# Patient Record
Sex: Male | Born: 1960 | Race: White | Hispanic: No | Marital: Married | Smoking: Former smoker
Health system: Southern US, Community
[De-identification: ages and names within clinical notes are randomized; demographics above are authoritative.]

## PROBLEM LIST (undated history)

## (undated) DIAGNOSIS — R51 Headache: Secondary | ICD-10-CM

## (undated) DIAGNOSIS — E785 Hyperlipidemia, unspecified: Secondary | ICD-10-CM

## (undated) DIAGNOSIS — J302 Other seasonal allergic rhinitis: Secondary | ICD-10-CM

## (undated) DIAGNOSIS — I10 Essential (primary) hypertension: Secondary | ICD-10-CM

## (undated) DIAGNOSIS — M1990 Unspecified osteoarthritis, unspecified site: Secondary | ICD-10-CM

## (undated) DIAGNOSIS — K219 Gastro-esophageal reflux disease without esophagitis: Secondary | ICD-10-CM

## (undated) DIAGNOSIS — F419 Anxiety disorder, unspecified: Secondary | ICD-10-CM

## (undated) DIAGNOSIS — F32A Depression, unspecified: Secondary | ICD-10-CM

## (undated) DIAGNOSIS — E781 Pure hyperglyceridemia: Secondary | ICD-10-CM

## (undated) DIAGNOSIS — F329 Major depressive disorder, single episode, unspecified: Secondary | ICD-10-CM

## (undated) DIAGNOSIS — J45909 Unspecified asthma, uncomplicated: Secondary | ICD-10-CM

## (undated) HISTORY — PX: OTHER SURGICAL HISTORY: SHX169

## (undated) HISTORY — DX: Morbid (severe) obesity due to excess calories: E66.01

## (undated) HISTORY — DX: Essential (primary) hypertension: I10

## (undated) HISTORY — DX: Pure hyperglyceridemia: E78.1

## (undated) HISTORY — DX: Hyperlipidemia, unspecified: E78.5

## (undated) HISTORY — PX: ROTATOR CUFF REPAIR: SHX139

---

## 2002-03-06 ENCOUNTER — Encounter: Payer: Self-pay | Admitting: Family Medicine

## 2002-03-06 ENCOUNTER — Ambulatory Visit (HOSPITAL_COMMUNITY): Admission: RE | Admit: 2002-03-06 | Discharge: 2002-03-06 | Payer: Self-pay | Admitting: Family Medicine

## 2003-01-21 ENCOUNTER — Encounter: Payer: Self-pay | Admitting: Emergency Medicine

## 2003-01-21 ENCOUNTER — Emergency Department (HOSPITAL_COMMUNITY): Admission: EM | Admit: 2003-01-21 | Discharge: 2003-01-21 | Payer: Self-pay | Admitting: Emergency Medicine

## 2003-02-10 ENCOUNTER — Ambulatory Visit (HOSPITAL_COMMUNITY): Admission: RE | Admit: 2003-02-10 | Discharge: 2003-02-10 | Payer: Self-pay | Admitting: Specialist

## 2003-02-10 ENCOUNTER — Encounter: Payer: Self-pay | Admitting: Specialist

## 2006-12-03 ENCOUNTER — Emergency Department (HOSPITAL_COMMUNITY): Admission: EM | Admit: 2006-12-03 | Discharge: 2006-12-03 | Payer: Self-pay | Admitting: Emergency Medicine

## 2006-12-03 ENCOUNTER — Encounter: Payer: Self-pay | Admitting: Emergency Medicine

## 2008-10-05 ENCOUNTER — Emergency Department (HOSPITAL_COMMUNITY): Admission: EM | Admit: 2008-10-05 | Discharge: 2008-10-05 | Payer: Self-pay | Admitting: Emergency Medicine

## 2008-10-06 ENCOUNTER — Encounter (INDEPENDENT_AMBULATORY_CARE_PROVIDER_SITE_OTHER): Payer: Self-pay | Admitting: *Deleted

## 2008-10-06 ENCOUNTER — Ambulatory Visit: Payer: Self-pay | Admitting: Orthopedic Surgery

## 2008-10-06 DIAGNOSIS — S61409A Unspecified open wound of unspecified hand, initial encounter: Secondary | ICD-10-CM | POA: Insufficient documentation

## 2008-10-15 ENCOUNTER — Ambulatory Visit: Payer: Self-pay | Admitting: Orthopedic Surgery

## 2008-10-15 ENCOUNTER — Encounter (INDEPENDENT_AMBULATORY_CARE_PROVIDER_SITE_OTHER): Payer: Self-pay | Admitting: *Deleted

## 2008-10-29 ENCOUNTER — Ambulatory Visit: Payer: Self-pay | Admitting: Orthopedic Surgery

## 2008-10-30 ENCOUNTER — Encounter (INDEPENDENT_AMBULATORY_CARE_PROVIDER_SITE_OTHER): Payer: Self-pay | Admitting: *Deleted

## 2008-11-10 ENCOUNTER — Encounter: Payer: Self-pay | Admitting: Orthopedic Surgery

## 2009-12-10 ENCOUNTER — Observation Stay (HOSPITAL_COMMUNITY): Admission: EM | Admit: 2009-12-10 | Discharge: 2009-12-11 | Payer: Self-pay | Admitting: Emergency Medicine

## 2009-12-10 ENCOUNTER — Ambulatory Visit: Payer: Self-pay | Admitting: Cardiology

## 2009-12-10 ENCOUNTER — Encounter (INDEPENDENT_AMBULATORY_CARE_PROVIDER_SITE_OTHER): Payer: Self-pay | Admitting: Internal Medicine

## 2009-12-30 ENCOUNTER — Encounter: Payer: Self-pay | Admitting: Adult Health

## 2009-12-30 ENCOUNTER — Ambulatory Visit: Payer: Self-pay | Admitting: Cardiology

## 2009-12-30 DIAGNOSIS — F411 Generalized anxiety disorder: Secondary | ICD-10-CM | POA: Insufficient documentation

## 2009-12-30 DIAGNOSIS — I10 Essential (primary) hypertension: Secondary | ICD-10-CM | POA: Insufficient documentation

## 2009-12-30 DIAGNOSIS — R079 Chest pain, unspecified: Secondary | ICD-10-CM | POA: Insufficient documentation

## 2010-06-17 NOTE — Assessment & Plan Note (Signed)
Summary: POST HOSP [place]/TG   Visit Type:  Follow-up Referring Deva Ron:  emergency room Primary Taija Mathias:  Dr.Fusco  CC:  no cardiology complaints.  History of Present Illness: Bryce Rush is a very pleasant 50 y/o CM who we are seeing on follow-up after hospitalization at [facility] for chest pain.  Cardiac enzymes and EKG were found to be normal.  Stress myoview was also found to  be normal.  He has a history of anxiety, hypertension, and diverticulitis.  He has recently been informed that he is being lald off from his job in Oct.  This has been very stressful to him. He denies recurrent chest discomfort since being discharged from [facility].  He has been placed on celexa and is complaining of ED since that time.  Otherwise he is without complaint.  Preventive Screening-Counseling & Management  Alcohol-Tobacco     Alcohol drinks/day: 1     Alcohol type: beer  Current Medications (verified): 1)  Aspir-Low 81 Mg Tbec (Aspirin) .... Take 1 Tab Daily 2)  Lipitor 40 Mg Tabs (Atorvastatin Calcium) .... Take 1 Tab Daily 3)  Nitrostat 0.4 Mg Subl (Nitroglycerin) .... Use As Directed For Chest Pain Not To Exceed 3 Tabs 4)  Celexa 40 Mg Tabs (Citalopram Hydrobromide) .... Take 1 Tab Daily 5)  Lisinopril 10 Mg Tabs (Lisinopril) .... Take 1 Tab Daily 6)  Locoid Lipocream 0.1 % Crea (Hydrocortisone Butyr Lipo Base) .... Apply Twice Daily 7)  Daily Multiple Vitamins  Tabs (Multiple Vitamin) .... Take 1 Tab Daily 8)  Cetirizine Hcl 10 Mg Tabs (Cetirizine Hcl) .... Take 1 Tab Daily  Allergies (verified): No Known Drug Allergies  Past History:  Past medical, surgical, family and social histories (including risk factors) reviewed, and no changes noted (except as noted below).  Past Medical History: Reviewed history from 12/25/2009 and no changes required. no major medical problems chest pain(myoview done 12/11/2009) dyslipidemia hypertension hypertriglyceridemia morbid obesity  Past Surgical  History: Reviewed history from 10/06/2008 and no changes required.  Rotator Cuff Repair4/[facility]  Family History: Reviewed history from 12/25/2009 and no changes required. negative family history Father:deceased early 34's with myocardial infarction Mother:alive with history of pulmonary emboli no cardiovascular disease.  Social History: Reviewed history from 12/25/2009 and no changes required. Patient is married.  Full Time Married  Tobacco Use - No.  Alcohol Use - yes(social) Regular Exercise - no Drug Use - no Alcohol drinks/day:  1  Review of Systems       All other systems have been reviewed and are negative unless stated above.   Vital Signs:  Patient profile:   50 year old male Height:      69 inches Weight:      209 pounds BMI:     30.98 Pulse rate:   82 / minute BP sitting:   119 / 69  (right arm)  Vitals Entered By: Dreama Saa, CNA (December 30, 2009 3:31 PM)  Physical Exam  General:  Well developed, well nourished, in no acute distress. Eyes:  L eye deviation Lungs:  Clear bilaterally to auscultation and percussion. Heart:  Non-displaced PMI, chest non-tender; regular rate and rhythm, S1, S2 without murmurs, rubs or gallops. Carotid upstroke normal, no bruit. Normal abdominal aortic size, no bruits. Femorals normal pulses, no bruits. Pedals normal pulses. No edema, no varicosities. Abdomen:  Bowel sounds positive; abdomen soft and non-tender without masses, organomegaly, or hernias noted. No hepatosplenomegaly. Obese Msk:  Back normal, normal gait. Muscle strength and tone normal.  Pulses:  pulses normal in all 4 extremities Extremities:  No clubbing or cyanosis. Neurologic:  Alert and oriented x 3. Psych:  Normal affect.   EKG  Procedure date:  12/30/2009  Findings:      Normal sinus rhythm with rate of:  78bpm  Impression & Recommendations:  Problem # 1:  CHEST PAIN-UNSPECIFIED (ICD-786.50) Patients tests during  hospitalization were found to be negative.  There was no indication for cardiac catherization.  He has had no further complaints of chest discomfort. Discussed the test results with the patient and his wife, providing a copy of the stress test results to him.  He states that it was not explained to him during hospitalization and he was reassured of the results.  No further cardiac w/u indicated at this time. His updated medication list for this problem includes:    Aspir-low 81 Mg Tbec (Aspirin) .Marland Kitchen... Take 1 tab daily    Nitrostat 0.4 Mg Subl (Nitroglycerin) ..... Use as directed for chest pain not to exceed 3 tabs    Lisinopril 10 Mg Tabs (Lisinopril) .Marland Kitchen... Take 1 tab daily  Problem # 2:  CHYPERTENSION, BENIGN (ICD-401.1) Currently well controlled at present.  Continue he medications as directed. His updated medication list for this problem includes:    Aspir-low 81 Mg Tbec (Aspirin) .Marland Kitchen... Take 1 tab daily    Lisinopril 10 Mg Tabs (Lisinopril) .Marland Kitchen... Take 1 tab daily  Problem # 3:  ANXIETY (ICD-300.00) Situational symptoms with loss of job. He is complaining of ED associated with this.  I have referred him to his primary care physician for alternative medications that do not cause this side effect.  Patient Instructions: 1)  Your physician recommends that you schedule a follow-up appointment in: as needed

## 2010-07-31 LAB — CARDIAC PANEL(CRET KIN+CKTOT+MB+TROPI)
CK, MB: 2.5 ng/mL (ref 0.3–4.0)
CK, MB: 2.9 ng/mL (ref 0.3–4.0)
Relative Index: 1.9 (ref 0.0–2.5)
Relative Index: 2.1 (ref 0.0–2.5)
Total CK: 123 U/L (ref 7–232)
Total CK: 154 U/L (ref 7–232)
Troponin I: 0.02 ng/mL (ref 0.00–0.06)
Troponin I: 0.02 ng/mL (ref 0.00–0.06)

## 2010-07-31 LAB — DIFFERENTIAL
Eosinophils Absolute: 0.1 10*3/uL (ref 0.0–0.7)
Lymphs Abs: 3.3 10*3/uL (ref 0.7–4.0)
Monocytes Absolute: 0.7 10*3/uL (ref 0.1–1.0)
Monocytes Relative: 9 % (ref 3–12)
Neutrophils Relative %: 47 % (ref 43–77)

## 2010-07-31 LAB — BASIC METABOLIC PANEL
CO2: 30 mEq/L (ref 19–32)
Calcium: 9.2 mg/dL (ref 8.4–10.5)
Chloride: 103 mEq/L (ref 96–112)
GFR calc Af Amer: >60 mL/min (ref >60)
Glucose, Bld: 102 mg/dL — ABNORMAL HIGH (ref 70–99)
Potassium: 3.8 mEq/L (ref 3.5–5.1)
Sodium: 138 mEq/L (ref 135–145)

## 2010-07-31 LAB — LIPID PANEL
Cholesterol: 228 mg/dL — ABNORMAL HIGH (ref 0–200)
HDL: 32 mg/dL — ABNORMAL LOW (ref >39)
VLDL: 43 mg/dL — ABNORMAL HIGH (ref 0–40)

## 2010-07-31 LAB — CBC
HCT: 44 % (ref 39.0–52.0)
Hemoglobin: 14.2 g/dL (ref 13.0–17.0)
Hemoglobin: 15.4 g/dL (ref 13.0–17.0)
MCH: 31.8 pg (ref 26.0–34.0)
MCV: 90 fL (ref 78.0–100.0)
MCV: 90.8 fL (ref 78.0–100.0)
Platelets: 171 10*3/uL (ref 150–400)
RBC: 4.6 MIL/uL (ref 4.22–5.81)
RBC: 4.84 MIL/uL (ref 4.22–5.81)
WBC: 6.1 10*3/uL (ref 4.0–10.5)

## 2010-07-31 LAB — COMPREHENSIVE METABOLIC PANEL
AST: 19 U/L (ref 0–37)
Albumin: 3.6 g/dL (ref 3.5–5.2)
Alkaline Phosphatase: 62 U/L (ref 39–117)
CO2: 30 mEq/L (ref 19–32)
Chloride: 104 mEq/L (ref 96–112)
GFR calc Af Amer: >60 mL/min (ref >60)
GFR calc non Af Amer: >60 mL/min (ref >60)
Potassium: 4.2 mEq/L (ref 3.5–5.1)
Total Bilirubin: 0.6 mg/dL (ref 0.3–1.2)

## 2010-07-31 LAB — POCT CARDIAC MARKERS: Myoglobin, poc: 72.4 ng/mL (ref 12–200)

## 2010-07-31 LAB — TSH: TSH: 0.945 u[IU]/mL (ref 0.350–4.500)

## 2011-02-21 ENCOUNTER — Encounter: Payer: Self-pay | Admitting: Adult Health

## 2011-02-28 LAB — URINALYSIS, ROUTINE W REFLEX MICROSCOPIC
Ketones, ur: NEGATIVE
Nitrite: NEGATIVE
Specific Gravity, Urine: 1.012
pH: 7.5

## 2011-02-28 LAB — BASIC METABOLIC PANEL
BUN: 9
GFR calc Af Amer: >60
GFR calc non Af Amer: >60
Potassium: 3.7
Sodium: 137

## 2011-02-28 LAB — CBC
HCT: 46.9
Platelets: 231
RBC: 5.39
WBC: 6.9

## 2011-02-28 LAB — DIFFERENTIAL
Eosinophils Relative: 1
Lymphocytes Relative: 28
Lymphs Abs: 1.9

## 2011-06-21 ENCOUNTER — Telehealth: Payer: Self-pay

## 2011-06-21 ENCOUNTER — Other Ambulatory Visit: Payer: Self-pay

## 2011-06-21 DIAGNOSIS — Z139 Encounter for screening, unspecified: Secondary | ICD-10-CM

## 2011-06-21 NOTE — Telephone Encounter (Signed)
Gastroenterology Pre-Procedure Form    Request Date: 06/21/2011       Requesting Physician: Dr. Sherwood Gambler     PATIENT INFORMATION:  Bryce Rush is a 51 y.o., male (DOB=1961-04-27).  PROCEDURE: Procedure(s) requested: colonoscopy Procedure Reason: screening for colon cancer  PATIENT REVIEW QUESTIONS: The patient reports the following:   1. Diabetes Melitis: no 2. Joint replacements in the past 12 months: no 3. Major health problems in the past 3 months: no 4. Has an artificial valve or MVP:no 5. Has been advised in past to take antibiotics in advance of a procedure like teeth cleaning: no}    MEDICATIONS & ALLERGIES:    Patient reports the following regarding taking any blood thinners:   Plavix? no Aspirin?no Coumadin?  no  Patient confirms/reports the following medications:  Current Outpatient Prescriptions  Medication Sig Dispense Refill  . ALPRAZolam (XANAX) 0.5 MG tablet Take 0.5 mg by mouth at bedtime as needed. Pt said he does not even take one daily. He takes one just as needed      . aspirin 81 MG tablet Take 81 mg by mouth daily.        Marland Kitchen buPROPion (WELLBUTRIN) 100 MG tablet Take 100 mg by mouth 2 (two) times daily. Just started taking one tablet daily x 7 days and then will begin one tablet three times a day      . cetirizine (ZYRTEC) 10 MG tablet Take 10 mg by mouth daily.        . citalopram (CELEXA) 40 MG tablet Take 40 mg by mouth daily.        . hydrocortisone butyrate (LUCOID) 0.1 % CREA cream Apply topically 2 (two) times daily.        Marland Kitchen lisinopril (PRINIVIL,ZESTRIL) 10 MG tablet Take 10 mg by mouth daily.        . metroNIDAZOLE (METROGEL) 1 % gel Apply topically daily. As directed      . Multiple Vitamin (MULTIVITAMIN) tablet Take 1 tablet by mouth daily.        . nitroGLYCERIN (NITROSTAT) 0.4 MG SL tablet Place 0.4 mg under the tongue as directed.        . pravastatin (PRAVACHOL) 40 MG tablet Take 40 mg by mouth daily.        Patient confirms/reports the  following allergies:  Allergies  Allergen Reactions  . Penicillins Other (See Comments)    Pt unsure/ was as a child  . Sulfa Antibiotics Other (See Comments)    Pt unsure/ as a child    Patient is appropriate to schedule for requested procedure(s): yes  AUTHORIZATION INFORMATION Primary Insurance:   ID #:   Group #:  Pre-Cert / Auth required: Pre-Cert / Auth #:   Secondary Insurance:   ID #:   Group #: Pre-Cert / Auth required:  Pre-Cert / Auth #:   No orders of the defined types were placed in this encounter.    SCHEDULE INFORMATION: Procedure has been scheduled as follows:  Date: 07/22/2011                    Time: 8:30 AM  Location: [facility] Short Stay  This Gastroenterology Pre-Precedure Form is being routed to the following provider(s) for review: Jonette Eva, MD

## 2011-06-21 NOTE — Telephone Encounter (Signed)
Pt called while triage nurse was at lunch. He wants to change his procedure date from 3/8 to 3/11 due to taking time off work. Please call him back at [phone] with new procedure time for 3/11

## 2011-06-21 NOTE — Telephone Encounter (Signed)
Rx and instructions mailed to pt.  

## 2011-06-21 NOTE — Telephone Encounter (Signed)
prepopik

## 2011-06-21 NOTE — Telephone Encounter (Signed)
Attempted to call pt. The connection sounded like fax, no answer. Nash Dimmer, [place].

## 2011-07-15 ENCOUNTER — Telehealth: Payer: Self-pay

## 2011-07-15 NOTE — Telephone Encounter (Signed)
Called to update triage prior to colonoscopy on 07/25/2011. No new medical problems and no change in medications.

## 2011-07-19 ENCOUNTER — Emergency Department (HOSPITAL_COMMUNITY)
Admission: EM | Admit: 2011-07-19 | Discharge: 2011-07-19 | Disposition: A | Discharge status: Home / Self Care | Payer: BC Managed Care – PPO | Attending: Emergency Medicine | Admitting: Emergency Medicine

## 2011-07-19 ENCOUNTER — Encounter (HOSPITAL_COMMUNITY): Payer: Self-pay | Admitting: *Deleted

## 2011-07-19 ENCOUNTER — Encounter (HOSPITAL_COMMUNITY): Payer: Self-pay | Admitting: Pharmacy Technician

## 2011-07-19 ENCOUNTER — Emergency Department (HOSPITAL_COMMUNITY): Payer: BC Managed Care – PPO

## 2011-07-19 DIAGNOSIS — Z7982 Long term (current) use of aspirin: Secondary | ICD-10-CM | POA: Insufficient documentation

## 2011-07-19 DIAGNOSIS — R109 Unspecified abdominal pain: Secondary | ICD-10-CM | POA: Insufficient documentation

## 2011-07-19 DIAGNOSIS — M7918 Myalgia, other site: Secondary | ICD-10-CM

## 2011-07-19 DIAGNOSIS — IMO0001 Reserved for inherently not codable concepts without codable children: Secondary | ICD-10-CM | POA: Insufficient documentation

## 2011-07-19 DIAGNOSIS — I10 Essential (primary) hypertension: Secondary | ICD-10-CM | POA: Insufficient documentation

## 2011-07-19 DIAGNOSIS — Z6829 Body mass index (BMI) 29.0-29.9, adult: Secondary | ICD-10-CM | POA: Insufficient documentation

## 2011-07-19 DIAGNOSIS — E785 Hyperlipidemia, unspecified: Secondary | ICD-10-CM | POA: Insufficient documentation

## 2011-07-19 LAB — URINALYSIS, ROUTINE W REFLEX MICROSCOPIC
Leukocytes, UA: NEGATIVE
Nitrite: NEGATIVE
Specific Gravity, Urine: 1.01 (ref 1.005–1.030)
Urobilinogen, UA: 0.2 mg/dL (ref 0.0–1.0)
pH: 7 (ref 5.0–8.0)

## 2011-07-19 MED ORDER — KETOROLAC TROMETHAMINE 30 MG/ML IJ SOLN
30.0000 mg | Freq: Once | INTRAMUSCULAR | Status: AC
  Administered 2011-07-19: 30 mg via INTRAVENOUS
  Filled 2011-07-19: qty 1

## 2011-07-19 MED ORDER — SODIUM CHLORIDE 0.9 % IV BOLUS (SEPSIS)
1000.0000 mL | Freq: Once | INTRAVENOUS | Status: AC
  Administered 2011-07-19: 1000 mL via INTRAVENOUS

## 2011-07-19 MED ORDER — MORPHINE SULFATE 4 MG/ML IJ SOLN
4.0000 mg | Freq: Once | INTRAMUSCULAR | Status: AC
  Administered 2011-07-19: 4 mg via INTRAVENOUS
  Filled 2011-07-19: qty 1

## 2011-07-19 MED ORDER — CYCLOBENZAPRINE HCL 10 MG PO TABS
10.0000 mg | ORAL_TABLET | Freq: Once | ORAL | Status: AC
  Administered 2011-07-19: 10 mg via ORAL

## 2011-07-19 MED ORDER — IBUPROFEN 600 MG PO TABS: 600.0000 mg | ORAL_TABLET | Freq: Four times a day (QID) | ORAL | Status: DC | PRN

## 2011-07-19 MED ORDER — CYCLOBENZAPRINE HCL 10 MG PO TABS: 10.0000 mg | ORAL_TABLET | Freq: Three times a day (TID) | ORAL | Status: DC | PRN

## 2011-07-19 MED ORDER — ONDANSETRON 4 MG PO TBDP
4.0000 mg | ORAL_TABLET | Freq: Once | ORAL | Status: AC
  Administered 2011-07-19: 4 mg via ORAL
  Filled 2011-07-19: qty 1

## 2011-07-19 MED ORDER — SODIUM CHLORIDE 0.9 % IV SOLN
INTRAVENOUS | Status: DC
  Administered 2011-07-19: 05:00:00 via INTRAVENOUS

## 2011-07-19 MED ORDER — CYCLOBENZAPRINE HCL 10 MG PO TABS
ORAL_TABLET | ORAL | Status: AC
  Filled 2011-07-19: qty 1

## 2011-07-19 NOTE — ED Notes (Signed)
Pt reports "stabbing" pain in rt flank starting approx 2 hs ago

## 2011-07-19 NOTE — Telephone Encounter (Signed)
REVIEWED.  

## 2011-07-19 NOTE — ED Provider Notes (Signed)
0730Patient here with right flank pain and c/o cough. CT negative for stones. Awaiting UA and chest xray.  UA and chest xrays are negative. Patient for discharge.  Nicoletta Dress. Colon Branch, MD 07/19/11 [phone]

## 2011-07-19 NOTE — Discharge Instructions (Signed)
Try heat on your back (don't burn your skin). Take the medications for your pain. Recheck if you get a fever, shortness of breath, cough or seem worse.

## 2011-07-19 NOTE — ED Notes (Signed)
Report given to Corena Herter RN

## 2011-07-19 NOTE — ED Provider Notes (Signed)
History     CSN: [id number]  Arrival date & time 07/19/11  0500   First MD Initiated Contact with Patient 07/19/11 0522      Chief Complaint  Patient presents with  . Flank Pain    (Consider location/radiation/quality/duration/timing/severity/associated sxs/prior treatment) HPI  Patient relates he gets up at 3:30 in the morning for his work. He relates about 2 hours ago he started having a stabbing pain in his right flank. He denies radiation. He denies nausea, vomiting, hematuria, diarrhea. He states taking a deep breath makes it hurt more. He states trying to relax makes it feel a little better but doesn't make it go away. He states at its worst it has been a 10 out of 10, he states currently it is 7/10. He states he's never had this before.  Patient relates his father had large kidney stones for years until he had what sounds like a parathyroidectomy done.  PCP Dr. Sherwood Gambler  Past Medical History  Diagnosis Date  . Chest pain     Myoview done 12/11/2009  . Dyslipidemia   . Hypertension   . Hypertriglyceridemia   . Morbid obesity     Past Surgical History  Procedure Date  . Rotator cuff repair     [facility]    Family History  Problem Relation Age of Onset  . Pulmonary embolism Mother   . Heart attack Father   renal stones FOP  History  Substance Use Topics  . Smoking status: Never Smoker   . Smokeless tobacco: Not on file  . Alcohol Use: Yes     Socially   Lives with spouse   Review of Systems  All other systems reviewed and are negative.    Allergies  Penicillins and Sulfa antibiotics  Home Medications   Current Outpatient Rx  Name Route Sig Dispense Refill  . ALPRAZOLAM 0.5 MG PO TABS Oral Take 0.5 mg by mouth at bedtime as needed. Pt said he does not even take one daily. He takes one just as needed    . ASPIRIN 81 MG PO TABS Oral Take 81 mg by mouth daily.      . BUPROPION HCL 100 MG PO TABS Oral Take 100 mg by mouth 2 (two) times  daily. Just started taking one tablet daily x 7 days and then will begin one tablet three times a day    . CETIRIZINE HCL 10 MG PO TABS Oral Take 10 mg by mouth daily.      Marland Kitchen CITALOPRAM HYDROBROMIDE 40 MG PO TABS Oral Take 40 mg by mouth daily.      Marland Kitchen HYDROCORTISONE BUTYRATE 0.1 % EX CREA Topical Apply topically 2 (two) times daily.      Marland Kitchen LISINOPRIL 10 MG PO TABS Oral Take 10 mg by mouth daily.      Marland Kitchen METRONIDAZOLE 1 % EX GEL Topical Apply topically daily. As directed    . ONE-DAILY MULTI VITAMINS PO TABS Oral Take 1 tablet by mouth daily.      Marland Kitchen NITROGLYCERIN 0.4 MG SL SUBL Sublingual Place 0.4 mg under the tongue as directed.      Marland Kitchen PRAVASTATIN SODIUM 40 MG PO TABS Oral Take 40 mg by mouth daily.      BP 144/91  Pulse 88  Temp(Src) 97.1 F (36.2 C) (Oral)  Resp 20  Ht 5\' 9"  (1.753 m)  Wt 203 lb (92.08 kg)  BMI 29.98 kg/m2  SpO2 97%  Vital signs normal    Physical Exam  Nursing note and vitals reviewed. Constitutional: He is oriented to person, place, and time. He appears well-developed and well-nourished.  Non-toxic appearance. He does not appear ill. No distress.  HENT:  Head: Normocephalic and atraumatic.  Right Ear: External ear normal.  Left Ear: External ear normal.  Nose: Nose normal. No mucosal edema or rhinorrhea.  Mouth/Throat: Oropharynx is clear and moist and mucous membranes are normal. No dental abscesses or uvula swelling.  Eyes: Conjunctivae and EOM are normal. Pupils are equal, round, and reactive to light.  Neck: Normal range of motion and full passive range of motion without pain. Neck supple.  Cardiovascular: Normal rate, regular rhythm and normal heart sounds.  Exam reveals no gallop and no friction rub.   No murmur heard. Pulmonary/Chest: Effort normal and breath sounds normal. No respiratory distress. He has no wheezes. He has no rhonchi. He has no rales. He exhibits no tenderness and no crepitus.  Abdominal: Soft. Normal appearance and bowel sounds are  normal. He exhibits no distension. There is no tenderness. There is no rebound and no guarding.       Patient has tenderness over his right flank.  Musculoskeletal: Normal range of motion. He exhibits no edema and no tenderness.       Moves all extremities well.   Neurological: He is alert and oriented to person, place, and time. He has normal strength. No cranial nerve deficit.  Skin: Skin is warm, dry and intact. No rash noted. No erythema. No pallor.  Psychiatric: He has a normal mood and affect. His speech is normal and behavior is normal. His mood appears not anxious.    ED Course  Procedures (including critical care time)  PT given IV morphine and zofran. After reviewing his CT scan he was given IV toradol. PT states he did have a mild cough this am. Will check CT scan.   Will turn over to Dr Colon Branch at change of shift to get UA results and CXR results.  Ct Abdomen Pelvis Wo Contrast  07/19/2011  *RADIOLOGY REPORT*  Clinical Data: Right flank pain question kidney stone  CT ABDOMEN AND PELVIS WITHOUT CONTRAST  Technique:  Multidetector CT imaging of the abdomen and pelvis was performed following the standard protocol without intravenous contrast.  Comparison: 12/03/2006  Findings: Lung bases clear. No urinary tract calcification, hydronephrosis or ureteral dilatation. Within limits of a nonenhanced exam, no focal abnormalities of the liver, spleen, pancreas, kidneys, or adrenal glands identified. Normal appendix. Diverticulosis of transverse through sigmoid colon without evidence of acute diverticulitis. Stomach and small bowel loops normal appearance. No mass, adenopathy or free fluid. Mild dilatation of proximal right inguinal canal question inguinal hernia containing fat. Few pelvic phleboliths. No acute osseous findings.  IMPRESSION: Question right inguinal hernia containing fat. Diverticulosis of transverse, descending and sigmoid colon without evidence of acute diverticulitis. No acute  abnormalities.  Original Report Authenticated By: Lollie Marrow, M.D.      1. Right flank pain   2. Musculoskeletal pain     New Prescriptions   CYCLOBENZAPRINE (FLEXERIL) 10 MG TABLET    Take 1 tablet (10 mg total) by mouth 3 (three) times daily as needed for muscle spasms.   IBUPROFEN (ADVIL,MOTRIN) 600 MG TABLET    Take 1 tablet (600 mg total) by mouth every 6 (six) hours as needed for pain.   Plan discharge by Dr Colon Branch after UA and CXR resulted  Devoria Albe, MD, FACEP    MDM  Ward Givens, MD 07/19/11 1859

## 2011-07-22 MED ORDER — SODIUM CHLORIDE 0.45 % IV SOLN
Freq: Once | INTRAVENOUS | Status: AC
  Administered 2011-07-25: 09:00:00 via INTRAVENOUS

## 2011-07-25 ENCOUNTER — Ambulatory Visit (HOSPITAL_COMMUNITY)
Admission: RE | Admit: 2011-07-25 | Discharge: 2011-07-25 | Disposition: A | Discharge status: Home / Self Care | Payer: BC Managed Care – PPO | Source: Ambulatory Visit | Attending: Gastroenterology | Admitting: Gastroenterology

## 2011-07-25 ENCOUNTER — Encounter (HOSPITAL_COMMUNITY): Payer: Self-pay | Admitting: *Deleted

## 2011-07-25 ENCOUNTER — Encounter (HOSPITAL_COMMUNITY)
Admission: RE | Disposition: A | Discharge status: Home / Self Care | Payer: Self-pay | Source: Ambulatory Visit | Attending: Gastroenterology

## 2011-07-25 DIAGNOSIS — Z1211 Encounter for screening for malignant neoplasm of colon: Secondary | ICD-10-CM | POA: Insufficient documentation

## 2011-07-25 DIAGNOSIS — I10 Essential (primary) hypertension: Secondary | ICD-10-CM | POA: Insufficient documentation

## 2011-07-25 DIAGNOSIS — E785 Hyperlipidemia, unspecified: Secondary | ICD-10-CM | POA: Insufficient documentation

## 2011-07-25 DIAGNOSIS — Z79899 Other long term (current) drug therapy: Secondary | ICD-10-CM | POA: Insufficient documentation

## 2011-07-25 DIAGNOSIS — K5730 Diverticulosis of large intestine without perforation or abscess without bleeding: Secondary | ICD-10-CM

## 2011-07-25 DIAGNOSIS — Z139 Encounter for screening, unspecified: Secondary | ICD-10-CM

## 2011-07-25 DIAGNOSIS — K648 Other hemorrhoids: Secondary | ICD-10-CM | POA: Insufficient documentation

## 2011-07-25 DIAGNOSIS — E781 Pure hyperglyceridemia: Secondary | ICD-10-CM | POA: Insufficient documentation

## 2011-07-25 HISTORY — DX: Depression, unspecified: F32.A

## 2011-07-25 HISTORY — DX: Headache: R51

## 2011-07-25 HISTORY — DX: Gastro-esophageal reflux disease without esophagitis: K21.9

## 2011-07-25 HISTORY — DX: Anxiety disorder, unspecified: F41.9

## 2011-07-25 HISTORY — DX: Other seasonal allergic rhinitis: J30.2

## 2011-07-25 HISTORY — DX: Major depressive disorder, single episode, unspecified: F32.9

## 2011-07-25 HISTORY — PX: COLONOSCOPY: SHX5424

## 2011-07-25 SURGERY — COLONOSCOPY
Anesthesia: Moderate Sedation

## 2011-07-25 MED ORDER — MEPERIDINE HCL 100 MG/ML IJ SOLN
INTRAMUSCULAR | Status: AC
  Filled 2011-07-25: qty 2

## 2011-07-25 MED ORDER — MIDAZOLAM HCL 5 MG/5ML IJ SOLN
INTRAMUSCULAR | Status: DC | PRN
  Administered 2011-07-25 (×2): 2 mg via INTRAVENOUS

## 2011-07-25 MED ORDER — PROMETHAZINE HCL 25 MG/ML IJ SOLN
INTRAMUSCULAR | Status: DC | PRN
  Administered 2011-07-25: 12.5 mg via INTRAVENOUS

## 2011-07-25 MED ORDER — PROMETHAZINE HCL 25 MG/ML IJ SOLN
INTRAMUSCULAR | Status: AC
  Filled 2011-07-25: qty 1

## 2011-07-25 MED ORDER — MEPERIDINE HCL 100 MG/ML IJ SOLN
INTRAMUSCULAR | Status: DC | PRN
  Administered 2011-07-25 (×2): 50 mg via INTRAVENOUS

## 2011-07-25 MED ORDER — STERILE WATER FOR IRRIGATION IR SOLN
Status: DC | PRN
  Administered 2011-07-25: 09:00:00

## 2011-07-25 MED ORDER — MIDAZOLAM HCL 5 MG/5ML IJ SOLN
INTRAMUSCULAR | Status: AC
  Filled 2011-07-25: qty 10

## 2011-07-25 MED ORDER — SODIUM CHLORIDE 0.9 % IJ SOLN
INTRAMUSCULAR | Status: AC
  Filled 2011-07-25: qty 10

## 2011-07-25 NOTE — H&P (Signed)
Primary Care Physician:  Cassell Smiles., MD, MD Primary Gastroenterologist:  Dr. Darrick Penna  Pre-Procedure History & Physical: HPI:  Bryce Rush is a 51 y.o. male here for COLON CANCER SCREENING.   Past Medical History  Diagnosis Date  . Chest pain     Myoview done 12/11/2009  . Dyslipidemia   . Hypertension   . Hypertriglyceridemia   . Morbid obesity   . Seasonal allergies   . Depression   . Headache   . GERD (gastroesophageal reflux disease)   . Anxiety     Past Surgical History  Procedure Date  . Rotator cuff repair     [facility]  . Bilateral eye surgery     X 4- as a child    Prior to Admission medications   Medication Sig Start Date End Date Taking? Authorizing Provider  ALPRAZolam Prudy Feeler) 0.5 MG tablet Take 0.5 mg by mouth at bedtime as needed. For sleep   Yes Historical Provider, MD  aspirin EC 81 MG tablet Take 81 mg by mouth daily.   Yes Historical Provider, MD  cetirizine (ZYRTEC) 10 MG tablet Take 10 mg by mouth daily.     Yes Historical Provider, MD  cyclobenzaprine (FLEXERIL) 10 MG tablet Take 10 mg by mouth 3 (three) times daily as needed. For muscle spasms 07/19/11 07/29/11 Yes Ward Givens, MD  hydrocortisone butyrate (LUCOID) 0.1 % CREA cream Apply 1 application topically daily as needed. Dry skin on face   Yes Historical Provider, MD  ibuprofen (ADVIL,MOTRIN) 600 MG tablet Take 600 mg by mouth every 6 (six) hours as needed. For pain 07/19/11 07/29/11 Yes Ward Givens, MD  Multiple Vitamin (MULITIVITAMIN WITH MINERALS) TABS Take 1 tablet by mouth daily.   Yes Historical Provider, MD  naproxen sodium (ALEVE) 220 MG tablet Take 220 mg by mouth 2 (two) times daily as needed. Pain   Yes Historical Provider, MD  nitroGLYCERIN (NITROSTAT) 0.4 MG SL tablet Place 0.4 mg under the tongue every 5 (five) minutes as needed. For chest pain   Yes Historical Provider, MD  buPROPion (WELLBUTRIN) 100 MG tablet Take 100 mg by mouth 2 (two) times daily.     Historical  Provider, MD  Cholecalciferol (VITAMIN D PO) Take 2 tablets by mouth daily.    Historical Provider, MD  citalopram (CELEXA) 40 MG tablet Take 40 mg by mouth daily.      Historical Provider, MD  lisinopril (PRINIVIL,ZESTRIL) 10 MG tablet Take 10 mg by mouth daily.      Historical Provider, MD  pravastatin (PRAVACHOL) 40 MG tablet Take 40 mg by mouth daily.    Historical Provider, MD    Allergies as of 06/21/2011 - Review Complete 06/21/2011  Allergen Reaction Noted  . Penicillins Other (See Comments) 06/21/2011  . Sulfa antibiotics Other (See Comments) 06/21/2011    Family History  Problem Relation Age of Onset  . Pulmonary embolism Mother   . Heart attack Father   . Colon cancer Neg Hx    NO COLON CA OR POLYPS   History   Social History  . Marital Status: Married    Spouse Name: N/A    Number of Children: N/A  . Years of Education: N/A   Occupational History  . Full time    Social History Main Topics  . Smoking status: Current Some Day Smoker -- 0.1 packs/day for 3 years    Types: Cigarettes  . Smokeless tobacco: Not on file  . Alcohol Use: 4.8 oz/week  6 Cans of beer, 2 Shots of liquor per week  . Drug Use: Yes    Special: Marijuana     Occasional  . Sexually Active: Not on file   Other Topics Concern  . Not on file   Social History Narrative   MarriedNo regular exercise    Review of Systems: See HPI, otherwise negative ROS   Physical Exam: BP 161/100  Pulse 67  Temp(Src) 98.3 F (36.8 C) (Oral)  Resp 19  Ht 5\' 9"  (1.753 m)  Wt 203 lb (92.08 kg)  BMI 29.98 kg/m2  SpO2 96% General:   Alert,  pleasant and cooperative in NAD Head:  Normocephalic and atraumatic. Neck:  Supple;  Lungs:  Clear throughout to auscultation.    Heart:  Regular rate and rhythm. Abdomen:  Soft, nontender and nondistended. Normal bowel sounds, without guarding, and without rebound.   Neurologic:  Alert and  oriented x4;  grossly normal neurologically.  Impression/Plan:       AVERAGE RISK  PLAN: TCS TODAY

## 2011-07-25 NOTE — Discharge Instructions (Signed)
You have small internal hemorrhoids and diverticulosis.  Next colonoscopy in 10 years. FOLLOW A HIGH FIBER DIET. AVOID ITEMS THAT CAUSE BLOATING. SEE INFO BELOW.   Colonoscopy Care After Read the instructions outlined below and refer to this sheet in the next week. These discharge instructions provide you with general information on caring for yourself after you leave the hospital. While your treatment has been planned according to the most current medical practices available, unavoidable complications occasionally occur. If you have any problems or questions after discharge, call DR. Skilynn Durney, [phone].  ACTIVITY  You may resume your regular activity, but move at a slower pace for the next 24 hours.   Take frequent rest periods for the next 24 hours.   Walking will help get rid of the air and reduce the bloated feeling in your belly (abdomen).   No driving for 24 hours (because of the medicine (anesthesia) used during the test).   You may shower.   Do not sign any important legal documents or operate any machinery for 24 hours (because of the anesthesia used during the test).    NUTRITION  Drink plenty of fluids.   You may resume your normal diet as instructed by your doctor.   Begin with a light meal and progress to your normal diet. Heavy or fried foods are harder to digest and may make you feel sick to your stomach (nauseated).   Avoid alcoholic beverages for 24 hours or as instructed.    MEDICATIONS  You may resume your normal medications.   WHAT YOU CAN EXPECT TODAY  Some feelings of bloating in the abdomen.   Passage of more gas than usual.   Spotting of blood in your stool or on the toilet paper  .  IF YOU HAD POLYPS REMOVED DURING THE COLONOSCOPY:  Eat a soft diet IF YOU HAVE NAUSEA, BLOATING, ABDOMINAL PAIN, OR VOMITING.    FINDING OUT THE RESULTS OF YOUR TEST Not all test results are available during your visit. DR. Darrick Penna WILL CALL YOU WITHIN 7  DAYS OF YOUR PROCEDUE WITH YOUR RESULTS. Do not assume everything is normal if you have not heard from DR. Haiven Nardone IN ONE WEEK, CALL HER OFFICE AT [phone].  SEEK IMMEDIATE MEDICAL ATTENTION AND CALL THE OFFICE: [phone] IF:  You have more than a spotting of blood in your stool.   Your belly is swollen (abdominal distention).   You are nauseated or vomiting.   You have a temperature over 101F.   You have abdominal pain or discomfort that is severe or gets worse throughout the day.  High-Fiber Diet A high-fiber diet changes your normal diet to include more whole grains, legumes, fruits, and vegetables. Changes in the diet involve replacing refined carbohydrates with unrefined foods. The calorie level of the diet is essentially unchanged. The Dietary Reference Intake (recommended amount) for adult males is 38 grams per day. For adult females, it is 25 grams per day. Pregnant and lactating women should consume 28 grams of fiber per day. Fiber is the intact part of a plant that is not broken down during digestion. Functional fiber is fiber that has been isolated from the plant to provide a beneficial effect in the body. PURPOSE  Increase stool bulk.   Ease and regulate bowel movements.   Lower cholesterol.  INDICATIONS THAT YOU NEED MORE FIBER  Constipation and hemorrhoids.   Uncomplicated diverticulosis (intestine condition) and irritable bowel syndrome.   Weight management.   As a protective measure against hardening  of the arteries (atherosclerosis), diabetes, and cancer.   GUIDELINES FOR INCREASING FIBER IN THE DIET  Start adding fiber to the diet slowly. A gradual increase of about 5 more grams (2 slices of whole-wheat bread, 2 servings of most fruits or vegetables, or 1 bowl of high-fiber cereal) per day is best. Too rapid an increase in fiber may result in constipation, flatulence, and bloating.   Drink enough water and fluids to keep your urine clear or pale yellow.  Water, juice, or caffeine-free drinks are recommended. Not drinking enough fluid may cause constipation.   Eat a variety of high-fiber foods rather than one type of fiber.   Try to increase your intake of fiber through using high-fiber foods rather than fiber pills or supplements that contain small amounts of fiber.   The goal is to change the types of food eaten. Do not supplement your present diet with high-fiber foods, but replace foods in your present diet.  INCLUDE A VARIETY OF FIBER SOURCES  Replace refined and processed grains with whole grains, canned fruits with fresh fruits, and incorporate other fiber sources. White rice, white breads, and most bakery goods contain little or no fiber.   Brown whole-grain rice, buckwheat oats, and many fruits and vegetables are all good sources of fiber. These include: broccoli, Brussels sprouts, cabbage, cauliflower, beets, sweet potatoes, white potatoes (skin on), carrots, tomatoes, eggplant, squash, berries, fresh fruits, and dried fruits.   Cereals appear to be the richest source of fiber. Cereal fiber is found in whole grains and bran. Bran is the fiber-rich outer coat of cereal grain, which is largely removed in refining. In whole-grain cereals, the bran remains. In breakfast cereals, the largest amount of fiber is found in those with "bran" in their names. The fiber content is sometimes indicated on the label.   You may need to include additional fruits and vegetables each day.   In baking, for 1 cup white flour, you may use the following substitutions:   1 cup whole-wheat flour minus 2 tablespoons.   1/2 cup white flour plus 1/2 cup whole-wheat flour.   Diverticulosis Diverticulosis is a common condition that develops when small pouches (diverticula) form in the wall of the colon. The risk of diverticulosis increases with age. It happens more often in people who eat a low-fiber diet. Most individuals with diverticulosis have no symptoms.  Those individuals with symptoms usually experience belly (abdominal) pain, constipation, or loose stools (diarrhea).  HOME CARE INSTRUCTIONS  Increase the amount of fiber in your diet as directed by your caregiver or dietician. This may reduce symptoms of diverticulosis.   Drink at least 6 to 8 glasses of water each day to prevent constipation.   Try not to strain when you have a bowel movement.   Avoiding nuts and seeds to prevent complications is still an uncertain benefit.       FOODS HAVING HIGH FIBER CONTENT INCLUDE:  Fruits. Apple, peach, pear, tangerine, raisins, prunes.   Vegetables. Brussels sprouts, asparagus, broccoli, cabbage, carrot, cauliflower, romaine lettuce, spinach, summer squash, tomato, winter squash, zucchini.   Starchy Vegetables. Baked beans, kidney beans, lima beans, split peas, lentils, potatoes (with skin).   Grains. Whole wheat bread, brown rice, bran flake cereal, plain oatmeal, white rice, shredded wheat, bran muffins.    SEEK IMMEDIATE MEDICAL CARE IF:  You develop increasing pain or severe bloating.   You have an oral temperature above 101F.   You develop vomiting or bowel movements that are bloody or  black.   Hemorrhoids Hemorrhoids are dilated (enlarged) veins around the rectum. Sometimes clots will form in the veins. This makes them swollen and painful. These are called thrombosed hemorrhoids. Causes of hemorrhoids include:  Constipation.   Straining to have a bowel movement.   HEAVY LIFTING HOME CARE INSTRUCTIONS  Eat a well balanced diet and drink 6 to 8 glasses of water every day to avoid constipation. You may also use a bulk laxative.   Avoid straining to have bowel movements.   Keep anal area dry and clean.   Do not use a donut shaped pillow or sit on the toilet for long periods. This increases blood pooling and pain.   Move your bowels when your body has the urge; this will require less straining and will decrease pain and  pressure.

## 2011-07-26 NOTE — Op Note (Signed)
[facility] [address] [place], [place]  [address]  COLONOSCOPY PROCEDURE REPORT  PATIENT:  Bryce, Rush  MR#:  [id number] BIRTHDATE:  Dec 26, 1960, 51 yrs. old  GENDER:  male  ENDOSCOPIST:  Jonette Eva, MD REF. BY:  Artis Delay, M.D. ASSISTANT:  PROCEDURE DATE:  07/25/2011 PROCEDURE:  Colonoscopy 19147  INDICATIONS:  screening  MEDICATIONS:   Demerol 100 mg IV, Versed 4 mg IV, promethazine (Phenergan) 12.5 mg IV  DESCRIPTION OF PROCEDURE:    Physical exam was performed. Informed consent was obtained from the patient after explaining the benefits, risks, and alternatives to procedure.  The patient was connected to monitor and placed in left lateral position. Continuous oxygen was provided by nasal cannula and IV medicine administered through an indwelling cannula.  After administration of sedation and rectal exam, the patient's rectum was intubated and the EC-3890Li ([id number]) and EC-3890Li ([id number]) colonoscope was advanced under direct visualization to the cecum.  The scope was removed slowly by carefully examining the color, texture, anatomy, and integrity mucosa on the way out.  The patient was recovered in endoscopy and discharged home in satisfactory condition. <<PROCEDUREIMAGES>>  FINDINGS:  FREQUENT Scattered diverticula were found DESCENDING  SIGMOID COLON.  SMALL Internal Hemorrhoids were found.  PREP QUALITY: GOOD CECAL W/D TIME:    9 minutes  COMPLICATIONS:    None  ENDOSCOPIC IMPRESSION: 1) DiverticulOSIS, scattered in the DESCENDING & SIGMOID COLON 2) Internal hemorrhoids  RECOMMENDATIONS: TCS IN 10 YEARS HIGH FIBER DIET  REPEAT EXAM:  No  ______________________________ Jonette Eva, MD  CC:  Artis Delay, M.D.  n. eSIGNED:   Kasheem Toner at 07/26/2011 02:35 PM  Yolonda Kida, [phone]

## 2011-07-28 ENCOUNTER — Encounter (HOSPITAL_COMMUNITY): Payer: Self-pay | Admitting: Gastroenterology

## 2015-02-19 ENCOUNTER — Encounter (HOSPITAL_COMMUNITY): Payer: Self-pay | Admitting: Emergency Medicine

## 2015-02-19 ENCOUNTER — Emergency Department (HOSPITAL_COMMUNITY)
Admission: EM | Admit: 2015-02-19 | Discharge: 2015-02-20 | Disposition: A | Discharge status: Home / Self Care | Payer: BLUE CROSS/BLUE SHIELD | Attending: Emergency Medicine | Admitting: Emergency Medicine

## 2015-02-19 DIAGNOSIS — Z7982 Long term (current) use of aspirin: Secondary | ICD-10-CM | POA: Insufficient documentation

## 2015-02-19 DIAGNOSIS — Z79899 Other long term (current) drug therapy: Secondary | ICD-10-CM | POA: Diagnosis not present

## 2015-02-19 DIAGNOSIS — Z880 Allergy status to penicillin: Secondary | ICD-10-CM | POA: Insufficient documentation

## 2015-02-19 DIAGNOSIS — E785 Hyperlipidemia, unspecified: Secondary | ICD-10-CM | POA: Diagnosis not present

## 2015-02-19 DIAGNOSIS — Z8719 Personal history of other diseases of the digestive system: Secondary | ICD-10-CM | POA: Insufficient documentation

## 2015-02-19 DIAGNOSIS — F329 Major depressive disorder, single episode, unspecified: Secondary | ICD-10-CM | POA: Insufficient documentation

## 2015-02-19 DIAGNOSIS — F419 Anxiety disorder, unspecified: Secondary | ICD-10-CM | POA: Diagnosis not present

## 2015-02-19 DIAGNOSIS — E781 Pure hyperglyceridemia: Secondary | ICD-10-CM | POA: Diagnosis not present

## 2015-02-19 DIAGNOSIS — R1031 Right lower quadrant pain: Secondary | ICD-10-CM | POA: Diagnosis present

## 2015-02-19 DIAGNOSIS — K4090 Unilateral inguinal hernia, without obstruction or gangrene, not specified as recurrent: Secondary | ICD-10-CM | POA: Insufficient documentation

## 2015-02-19 DIAGNOSIS — I10 Essential (primary) hypertension: Secondary | ICD-10-CM | POA: Diagnosis not present

## 2015-02-19 DIAGNOSIS — R109 Unspecified abdominal pain: Secondary | ICD-10-CM

## 2015-02-19 DIAGNOSIS — Z87891 Personal history of nicotine dependence: Secondary | ICD-10-CM | POA: Insufficient documentation

## 2015-02-19 LAB — URINALYSIS, ROUTINE W REFLEX MICROSCOPIC
Bilirubin Urine: NEGATIVE
Glucose, UA: NEGATIVE mg/dL
Hgb urine dipstick: NEGATIVE
Ketones, ur: NEGATIVE mg/dL
Leukocytes, UA: NEGATIVE
Nitrite: NEGATIVE
Protein, ur: NEGATIVE mg/dL
Specific Gravity, Urine: 1.025 (ref 1.005–1.030)
Urobilinogen, UA: 0.2 mg/dL (ref 0.0–1.0)
pH: 5 (ref 5.0–8.0)

## 2015-02-19 MED ORDER — KETOROLAC TROMETHAMINE 15 MG/ML IJ SOLN
15.0000 mg | Freq: Once | INTRAMUSCULAR | Status: AC
  Administered 2015-02-19: 15 mg via INTRAVENOUS
  Filled 2015-02-19: qty 1

## 2015-02-19 MED ORDER — MORPHINE SULFATE (PF) 4 MG/ML IV SOLN
6.0000 mg | Freq: Once | INTRAVENOUS | Status: AC
  Administered 2015-02-19: 6 mg via INTRAVENOUS
  Filled 2015-02-19: qty 2

## 2015-02-19 MED ORDER — SODIUM CHLORIDE 0.9 % IV BOLUS (SEPSIS)
1000.0000 mL | Freq: Once | INTRAVENOUS | Status: AC
  Administered 2015-02-19: 1000 mL via INTRAVENOUS

## 2015-02-19 NOTE — ED Provider Notes (Signed)
CSN: [id number]     Arrival date & time 02/19/15  2102 History   First MD Initiated Contact with Patient 02/19/15 2333     Chief Complaint  Patient presents with  . Groin Pain     (Consider location/radiation/quality/duration/timing/severity/associated sxs/prior Treatment) HPI   54 year old male with right lower quadrant/right inguinal pain. Patient woke up the pain this morning. He went to bed in his usual state of health. Pain has progressively worsened throughout the day. Constant. Worse with certain movements. Patient is a carrier for [employer]. He was able to complete his days work. No urinary complaints. No fevers or chills. No nausea or vomiting. No testicular pain, swelling or mass. No significant past surgical history. Did not try taking anything for his symptoms.  Past Medical History  Diagnosis Date  . Chest pain     Myoview done 12/11/2009  . Dyslipidemia   . Hypertension   . Hypertriglyceridemia   . Morbid obesity (HCC)   . Seasonal allergies   . Depression   . Headache(784.0)   . GERD (gastroesophageal reflux disease)   . Anxiety    Past Surgical History  Procedure Laterality Date  . Rotator cuff repair      [facility]  . Bilateral eye surgery      X 4- as a child  . Colonoscopy  07/25/2011    Procedure: COLONOSCOPY;  Surgeon: West Bali, MD;  Location: [facility];  Service: Endoscopy;  Laterality: N/A;  8:30 AM   Family History  Problem Relation Age of Onset  . Pulmonary embolism Mother   . Heart attack Father   . Colon cancer Neg Hx    Social History  Substance Use Topics  . Smoking status: Former Smoker -- 0.10 packs/day for 3 years    Types: Cigarettes    Quit date: 02/18/2013  . Smokeless tobacco: None  . Alcohol Use: 4.8 oz/week    6 Cans of beer, 2 Shots of liquor per week    Review of Systems  All systems reviewed and negative, other than as noted in HPI.   Allergies  Penicillins and Sulfa antibiotics  Home Medications    Prior to Admission medications   Medication Sig Start Date End Date Taking? Authorizing Provider  ALPRAZolam Prudy Feeler) 0.5 MG tablet Take 0.5 mg by mouth at bedtime as needed. For sleep    Historical Provider, MD  aspirin EC 81 MG tablet Take 81 mg by mouth daily.    Historical Provider, MD  buPROPion (WELLBUTRIN) 100 MG tablet Take 100 mg by mouth 2 (two) times daily.     Historical Provider, MD  cetirizine (ZYRTEC) 10 MG tablet Take 10 mg by mouth daily.      Historical Provider, MD  Cholecalciferol (VITAMIN D PO) Take 2 tablets by mouth daily.    Historical Provider, MD  citalopram (CELEXA) 40 MG tablet Take 40 mg by mouth daily.      Historical Provider, MD  hydrocortisone butyrate (LUCOID) 0.1 % CREA cream Apply 1 application topically daily as needed. Dry skin on face    Historical Provider, MD  lisinopril (PRINIVIL,ZESTRIL) 10 MG tablet Take 10 mg by mouth daily.      Historical Provider, MD  Multiple Vitamin (MULITIVITAMIN WITH MINERALS) TABS Take 1 tablet by mouth daily.    Historical Provider, MD  naproxen sodium (ALEVE) 220 MG tablet Take 220 mg by mouth 2 (two) times daily as needed. Pain    Historical Provider, MD  nitroGLYCERIN (NITROSTAT) 0.4  MG SL tablet Place 0.4 mg under the tongue every 5 (five) minutes as needed. For chest pain    Historical Provider, MD  pravastatin (PRAVACHOL) 40 MG tablet Take 40 mg by mouth daily.    Historical Provider, MD   BP 129/81 mmHg  Pulse 66  Temp(Src) 98.3 F (36.8 C) (Oral)  Resp 20  Ht  (1.753 m)  Wt 220 lb (99.791 kg)  BMI 32.47 kg/m2  SpO2 99% Physical Exam  Constitutional: He appears well-developed and well-nourished. No distress.  HENT:  Head: Normocephalic and atraumatic.  Eyes: Conjunctivae are normal. Right eye exhibits no discharge. Left eye exhibits no discharge.  Neck: Neck supple.  Cardiovascular: Normal rate, regular rhythm and normal heart sounds.  Exam reveals no gallop and no friction rub.   No murmur  heard. Pulmonary/Chest: Effort normal and breath sounds normal. No respiratory distress.  Abdominal: Soft. He exhibits no distension. There is tenderness.  RLQ tenderness. No rebound or guarding. +psoas sign. Mild tenderness along R inguinal crease. No overlying skin changes. No hernia appreciate. Normal external genitalia. Testicular lie is normal and no testicular tenderness.   Genitourinary:  No cva tenderness  Musculoskeletal: He exhibits no edema or tenderness.  Neurological: He is alert.  Skin: Skin is warm and dry.  Psychiatric: He has a normal mood and affect. His behavior is normal. Thought content normal.  Nursing note and vitals reviewed.   ED Course  Procedures (including critical care time) Labs Review Labs Reviewed  URINALYSIS, ROUTINE W REFLEX MICROSCOPIC (NOT AT [facility]) - Abnormal; Notable for the following:    APPearance CLOUDY (*)    All other components within normal limits  BASIC METABOLIC PANEL - Abnormal; Notable for the following:    Chloride 100 (*)    Glucose, Bld 101 (*)    All other components within normal limits  CBC WITH DIFFERENTIAL/PLATELET    Imaging Review Ct Abdomen Pelvis W Contrast  02/20/2015   CLINICAL DATA:  Acute onset of right lower quadrant abdominal pain for 1 day. Initial encounter.  EXAM: CT ABDOMEN AND PELVIS WITH CONTRAST  TECHNIQUE: Multidetector CT imaging of the abdomen and pelvis was performed using the standard protocol following bolus administration of intravenous contrast.  CONTRAST:  OMNIPAQUE IOHEXOL 300 MG/ML  SOLN  COMPARISON:  CT of the abdomen and pelvis from 07/19/2011  FINDINGS: Minimal bibasilar atelectasis is noted.  The liver and spleen are unremarkable in appearance. The gallbladder is within normal limits. The pancreas and adrenal glands are unremarkable.  The kidneys are unremarkable in appearance. There is no evidence of hydronephrosis. No renal or ureteral stones are seen. Minimal nonspecific perinephric stranding  is noted bilaterally.  No free fluid is identified. The small bowel is unremarkable in appearance. The stomach is within normal limits. No acute vascular abnormalities are seen.  The appendix is normal in caliber, without evidence of appendicitis. Diverticulosis is noted along the transverse, descending and proximal sigmoid colon, without evidence of diverticulitis.  The bladder is mildly distended and grossly unremarkable. The prostate remains normal in size. A small right inguinal hernia is noted, containing only fat. No inguinal lymphadenopathy is seen.  No acute osseous abnormalities are identified.  IMPRESSION: 1. No acute abnormality seen within the abdomen or pelvis. 2. Diverticulosis along the transverse, descending and proximal sigmoid colon, without evidence of diverticulitis. 3. Small right inguinal hernia, containing only fat.   Electronically Signed   By: Roanna Raider M.D.   On: 02/20/2015 02:17  I have personally reviewed and evaluated these images and lab results as part of my medical decision-making.   EKG Interpretation None      MDM   Final diagnoses:  Abdominal pain, unspecified abdominal location  Right inguinal hernia    54 year old male with right lower quadrant/right inguinal pain. He does have some mild tenderness to palpation along the right inguinal crease. No hernia was appreciated. No overlying skin changes. Genital exam unremarkable. Patient is significantly more tender in right lower quadrant. Positive psoas sign. UA unremarkable. Some concern for possible appendicitis. Will place IV. Basic labs. CT abdomen pelvis. NPO. Pain meds.     Raeford Razor, MD 02/20/15 1640

## 2015-02-19 NOTE — ED Notes (Signed)
C/o R groin pain since this morning.  Pt believes he may have a hernia.  Denies any urinary symptoms.

## 2015-02-19 NOTE — ED Notes (Signed)
Pt c/o R groin pain onset this morning. Reports being a carrier for [employer] and has constant heavy

## 2015-02-20 ENCOUNTER — Emergency Department (HOSPITAL_COMMUNITY): Payer: BLUE CROSS/BLUE SHIELD

## 2015-02-20 ENCOUNTER — Encounter (HOSPITAL_COMMUNITY): Payer: Self-pay

## 2015-02-20 DIAGNOSIS — K4090 Unilateral inguinal hernia, without obstruction or gangrene, not specified as recurrent: Secondary | ICD-10-CM | POA: Diagnosis not present

## 2015-02-20 LAB — BASIC METABOLIC PANEL
Anion gap: 9 (ref 5–15)
BUN: 11 mg/dL (ref 6–20)
CO2: 28 mmol/L (ref 22–32)
Calcium: 9.1 mg/dL (ref 8.9–10.3)
Chloride: 100 mmol/L — ABNORMAL LOW (ref 101–111)
Creatinine, Ser: 0.91 mg/dL (ref 0.61–1.24)
GFR calc Af Amer: >60 mL/min (ref >60)
GFR calc non Af Amer: >60 mL/min (ref >60)
Glucose, Bld: 101 mg/dL — ABNORMAL HIGH (ref 65–99)
Potassium: 3.9 mmol/L (ref 3.5–5.1)
Sodium: 137 mmol/L (ref 135–145)

## 2015-02-20 LAB — CBC WITH DIFFERENTIAL/PLATELET
Basophils Absolute: 0 10*3/uL (ref 0.0–0.1)
Basophils Relative: 0 %
Eosinophils Absolute: 0.1 10*3/uL (ref 0.0–0.7)
Eosinophils Relative: 1 %
HCT: 45.6 % (ref 39.0–52.0)
Hemoglobin: 15.7 g/dL (ref 13.0–17.0)
Lymphocytes Relative: 36 %
Lymphs Abs: 3 10*3/uL (ref 0.7–4.0)
MCH: 31.2 pg (ref 26.0–34.0)
MCHC: 34.4 g/dL (ref 30.0–36.0)
MCV: 90.7 fL (ref 78.0–100.0)
Monocytes Absolute: 0.9 10*3/uL (ref 0.1–1.0)
Monocytes Relative: 11 %
Neutro Abs: 4.4 10*3/uL (ref 1.7–7.7)
Neutrophils Relative %: 52 %
Platelets: 178 10*3/uL (ref 150–400)
RBC: 5.03 MIL/uL (ref 4.22–5.81)
RDW: 12.9 % (ref 11.5–15.5)
WBC: 8.4 10*3/uL (ref 4.0–10.5)

## 2015-02-20 MED ORDER — IOHEXOL 300 MG/ML  SOLN
100.0000 mL | Freq: Once | INTRAMUSCULAR | Status: AC | PRN
  Administered 2015-02-20: 100 mL via INTRAVENOUS

## 2015-02-20 MED ORDER — ONDANSETRON HCL 4 MG/2ML IJ SOLN: 4.0000 mg | INTRAMUSCULAR | Status: DC | PRN

## 2015-02-20 MED ORDER — MORPHINE SULFATE (PF) 4 MG/ML IV SOLN
4.0000 mg | INTRAVENOUS | Status: DC | PRN
Start: 2015-02-20 — End: 2015-02-20
  Administered 2015-02-20: 4 mg via INTRAVENOUS
  Filled 2015-02-20: qty 1

## 2015-02-20 NOTE — Discharge Instructions (Signed)
It was our pleasure to provide your ER care today - we hope that you feel better.  Your CT scan was read as showing no acute process.  Incidental note was made of small right-sided inguinal hernia - follow up with general surgeon in the next few weeks - see referral.  Incidental note was also made of diverticula of the colon.  Return to ER if worse, new symptoms, fevers, other concern.  You were given pain medication in the ER - no driving for the next 4 hours.    Inguinal Hernia, Adult Muscles help keep everything in the body in its proper place. But if a weak spot in the muscles develops, something can poke through. That is called a hernia. When this happens in the lower part of the belly (abdomen), it is called an inguinal hernia. (It takes its name from a part of the body in this region called the inguinal canal.) A weak spot in the wall of muscles lets some fat or part of the small intestine bulge through. An inguinal hernia can develop at any age. Men get them more often than women. CAUSES  In adults, an inguinal hernia develops over time.  It can be triggered by:  Suddenly straining the muscles of the lower abdomen.  Lifting heavy objects.  Straining to have a bowel movement. Difficult bowel movements (constipation) can lead to this.  Constant coughing. This may be caused by smoking or lung disease.  Being overweight.  Being pregnant.  Working at a job that requires long periods of standing or heavy lifting.  Having had an inguinal hernia before. One type can be an emergency situation. It is called a strangulated inguinal hernia. It develops if part of the small intestine slips through the weak spot and cannot get back into the abdomen. The blood supply can be cut off. If that happens, part of the intestine may die. This situation requires emergency surgery. SYMPTOMS  Often, a small inguinal hernia has no symptoms. It is found when a healthcare provider does a physical exam.  Larger hernias usually have symptoms.   In adults, symptoms may include:  A lump in the groin. This is easier to see when the person is standing. It might disappear when lying down.  In men, a lump in the scrotum.  Pain or burning in the groin. This occurs especially when lifting, straining or coughing.  A dull ache or feeling of pressure in the groin.  Signs of a strangulated hernia can include:  A bulge in the groin that becomes very painful and tender to the touch.  A bulge that turns red or purple.  Fever, nausea and vomiting.  Inability to have a bowel movement or to pass gas. DIAGNOSIS  To decide if you have an inguinal hernia, a healthcare provider will probably do a physical examination.  This will include asking questions about any symptoms you have noticed.  The healthcare provider might feel the groin area and ask you to cough. If an inguinal hernia is felt, the healthcare provider may try to slide it back into the abdomen.  Usually no other tests are needed. TREATMENT  Treatments can vary. The size of the hernia makes a difference. Options include:  Watchful waiting. This is often suggested if the hernia is small and you have had no symptoms.  No medical procedure will be done unless symptoms develop.  You will need to watch closely for symptoms. If any occur, contact your healthcare provider right away.  Surgery. This is used if the hernia is larger or you have symptoms.  Open surgery. This is usually an outpatient procedure (you will not stay overnight in a hospital). An cut (incision) is made through the skin in the groin. The hernia is put back inside the abdomen. The weak area in the muscles is then repaired by herniorrhaphy or hernioplasty. Herniorrhaphy: in this type of surgery, the weak muscles are sewn back together. Hernioplasty: a patch or mesh is used to close the weak area in the abdominal wall.  Laparoscopy. In this procedure, a surgeon makes small  incisions. A thin tube with a tiny video camera (called a laparoscope) is put into the abdomen. The surgeon repairs the hernia with mesh by looking with the video camera and using two long instruments. HOME CARE INSTRUCTIONS   After surgery to repair an inguinal hernia:  You will need to take pain medicine prescribed by your healthcare provider. Follow all directions carefully.  You will need to take care of the wound from the incision.  Your activity will be restricted for awhile. This will probably include no heavy lifting for several weeks. You also should not do anything too active for a few weeks. When you can return to work will depend on the type of job that you have.  During "watchful waiting" periods, you should:  Maintain a healthy weight.  Eat a diet high in fiber (fruits, vegetables and whole grains).  Drink plenty of fluids to avoid constipation. This means drinking enough water and other liquids to keep your urine clear or pale yellow.  Do not lift heavy objects.  Do not stand for long periods of time.  Quit smoking. This should keep you from developing a frequent cough. SEEK MEDICAL CARE IF:   A bulge develops in your groin area.  You feel pain, a burning sensation or pressure in the groin. This might be worse if you are lifting or straining.  You develop a fever of more than 100.5 F (38.1 C). SEEK IMMEDIATE MEDICAL CARE IF:   Pain in the groin increases suddenly.  A bulge in the groin gets bigger suddenly and does not go down.  For men, there is sudden pain in the scrotum. Or, the size of the scrotum increases.  A bulge in the groin area becomes red or purple and is painful to touch.  You have nausea or vomiting that does not go away.  You feel your heart beating much faster than normal.  You cannot have a bowel movement or pass gas.  You develop a fever of more than 102.0 F (38.9 C).   This information is not intended to replace advice given to  you by your health care provider. Make sure you discuss any questions you have with your health care provider.   Document Released: 09/18/2008 Document Revised: 07/25/2011 Document Reviewed: 11/03/2014 Elsevier Interactive Patient Education 2016 [employer].  Diverticulosis Diverticulosis is the condition that develops when small pouches (diverticula) form in the wall of your colon. Your colon, or large intestine, is where water is absorbed and stool is formed. The pouches form when the inside layer of your colon pushes through weak spots in the outer layers of your colon. CAUSES  No one knows exactly what causes diverticulosis. RISK FACTORS  Being older than 50. Your risk for this condition increases with age. Diverticulosis is rare in people younger than 40 years. By age 14, almost everyone has it.  Eating a low-fiber diet.  Being  frequently constipated.  Being overweight.  Not getting enough exercise.  Smoking.  Taking over-the-counter pain medicines, like aspirin and ibuprofen. SYMPTOMS  Most people with diverticulosis do not have symptoms. DIAGNOSIS  Because diverticulosis often has no symptoms, health care providers often discover the condition during an exam for other colon problems. In many cases, a health care provider will diagnose diverticulosis while using a flexible scope to examine the colon (colonoscopy). TREATMENT  If you have never developed an infection related to diverticulosis, you may not need treatment. If you have had an infection before, treatment may include:  Eating more fruits, vegetables, and grains.  Taking a fiber supplement.  Taking a live bacteria supplement (probiotic).  Taking medicine to relax your colon. HOME CARE INSTRUCTIONS   Drink at least 6-8 glasses of water each day to prevent constipation.  Try not to strain when you have a bowel movement.  Keep all follow-up appointments. If you have had an infection before:  Increase the  fiber in your diet as directed by your health care provider or dietitian.  Take a dietary fiber supplement if your health care provider approves.  Only take medicines as directed by your health care provider. SEEK MEDICAL CARE IF:   You have abdominal pain.  You have bloating.  You have cramps.  You have not gone to the bathroom in 3 days. SEEK IMMEDIATE MEDICAL CARE IF:   Your pain gets worse.  Yourbloating becomes very bad.  You have a fever or chills, and your symptoms suddenly get worse.  You begin vomiting.  You have bowel movements that are bloody or black. MAKE SURE YOU:  Understand these instructions.  Will watch your condition.  Will get help right away if you are not doing well or get worse.   This information is not intended to replace advice given to you by your health care provider. Make sure you discuss any questions you have with your health care provider.   Document Released: 01/28/2004 Document Revised: 05/07/2013 Document Reviewed: 03/27/2013 Elsevier Interactive Patient Education [employer]

## 2015-02-20 NOTE — ED Provider Notes (Signed)
Signed out by Dr Juleen China at [facility] that ct pending, and that d/c instructions done, and that if ct neg for acute process to d/c home.  Ct negative acute.  Right inguinal hernia noted, not incarcerated. abd soft nt. Pt comfortable. Afeb.   Discussed results w pt.    Will refer to gen surg re ing hernia.     Cathren Laine, MD 02/20/15 [phone]

## 2015-02-20 NOTE — ED Notes (Signed)
Dr. Steinl at bedside 

## 2015-02-20 NOTE — ED Notes (Signed)
Pt in CT.

## 2015-03-10 ENCOUNTER — Ambulatory Visit: Payer: Self-pay | Admitting: General Surgery

## 2015-03-10 NOTE — H&P (Signed)
History of Present Illness (Razan Siler MD; 03/10/2015 1:30 PM) Patient words: Evaluate right inguinal hernia.  The patient is a 54 year old male who presents with an inguinal hernia. The patient is a 54-year-old male who is referred by Mary Ruth Hunt, M.D. for an evaluation of right inguinal hernia. Patient states that he had some right inguinal pain and noticed a small bulge. 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 The patient had been referred for workers comp.   Other Problems (Christy Moore, CMA; 03/10/2015 1:18 PM) Anxiety Disorder Depression Diverticulosis High blood pressure  Past Surgical History (Christy Moore, CMA; 03/10/2015 1:18 PM) Shoulder Surgery Right.  Diagnostic Studies History (Christy Moore, CMA; 03/10/2015 1:18 PM) Colonoscopy 5-10 years ago  Allergies (Christy Moore, CMA; 03/10/2015 1:19 PM) Penicillin V *PENICILLINS* SulfADIAZINE *Sulfonamides  Medication History (Christy Moore, CMA; 03/10/2015 1:20 PM) Citalopram Hydrobromide (40MG Tablet, Oral) Active. Lisinopril (10MG Tablet, Oral) Active. Nitrostat (0.4MG Tab Sublingual, Sublingual) Active. Multi Vitamin Daily (Oral) Active.  Social History (Christy Moore, CMA; 03/10/2015 1:18 PM) Alcohol use Moderate alcohol use. Caffeine use Coffee. Illicit drug use Remotely quit drug use. Tobacco use Former smoker.  Family History (Christy Moore, CMA; 03/10/2015 1:18 PM) Arthritis Father, Mother. Depression Father, Mother. Heart Disease Father. Hypertension Father, Mother. Respiratory Condition Mother. Thyroid problems Father.    Review of Systems (Christy Moore CMA; 03/10/2015 1:18 PM) General Not Present- Appetite Loss, Chills, Fatigue, Fever, Night Sweats, Weight Gain and Weight Loss. Skin Present- Rash. Not Present- Change in Wart/Mole, Dryness, Hives, Jaundice, New Lesions, Non-Healing Wounds and Ulcer. HEENT Present-  Seasonal Allergies and Wears glasses/contact lenses. Not Present- Earache, Hearing Loss, Hoarseness, Nose Bleed, Oral Ulcers, Ringing in the Ears, Sinus Pain, Sore Throat, Visual Disturbances and Yellow Eyes. Respiratory Present- Snoring. Not Present- Bloody sputum, Chronic Cough, Difficulty Breathing and Wheezing. Breast Not Present- Breast Mass, Breast Pain, Nipple Discharge and Skin Changes. Cardiovascular Not Present- Chest Pain, Difficulty Breathing Lying Down, Leg Cramps, Palpitations, Rapid Heart Rate, Shortness of Breath and Swelling of Extremities. Gastrointestinal Not Present- Abdominal Pain, Bloating, Bloody Stool, Change in Bowel Habits, Chronic diarrhea, Constipation, Difficulty Swallowing, Excessive gas, Gets full quickly at meals, Hemorrhoids, Indigestion, Nausea, Rectal Pain and Vomiting. Male Genitourinary Not Present- Blood in Urine, Change in Urinary Stream, Frequency, Impotence, Nocturia, Painful Urination, Urgency and Urine Leakage. Musculoskeletal Not Present- Back Pain, Joint Pain, Joint Stiffness, Muscle Pain, Muscle Weakness and Swelling of Extremities. Neurological Not Present- Decreased Memory, Fainting, Headaches, Numbness, Seizures, Tingling, Tremor, Trouble walking and Weakness. Psychiatric Present- Depression. Not Present- Anxiety, Bipolar, Change in Sleep Pattern, Fearful and Frequent crying. Endocrine Not Present- Cold Intolerance, Excessive Hunger, Hair Changes, Heat Intolerance, Hot flashes and New Diabetes. Hematology Not Present- Easy Bruising, Excessive bleeding, Gland problems, HIV and Persistent Infections.  Vitals (Christy Moore CMA; 03/10/2015 1:18 PM) 03/10/2015 1:18 PM Weight: 218 lb Height: 69in Body Surface Area: 2.14 m Body Mass Index: 32.19 kg/m  Temp.: 97.4F(Temporal)  Pulse: 76 (Regular)  Resp.: 16 (Unlabored)  BP: 118/84 (Sitting, Left Arm, Standard)       Physical Exam (Tyishia Aune, MD; 03/10/2015 1:30  PM) General Mental Status-Alert. General Appearance-Consistent with stated age. Hydration-Well hydrated. Voice-Normal.  Head and Neck Head-normocephalic, atraumatic with no lesions or palpable masses. Trachea-midline.  Eye Eyeball - Bilateral-Extraocular movements intact. Sclera/Conjunctiva - Bilateral-No scleral icterus.  Chest and Lung Exam Chest and lung exam reveals -quiet, even and easy respiratory effort with no use of accessory muscles. Inspection   Chest Wall - Normal. Back - normal.  Cardiovascular Cardiovascular examination reveals -normal heart sounds, regular rate and rhythm with no murmurs.  Abdomen Inspection Skin - Scar - no surgical scars. Hernias - Inguinal hernia - Right - Reducible. Palpation/Percussion Normal exam - Soft, Non Tender, No Rebound tenderness, No Rigidity (guarding) and No hepatosplenomegaly. Auscultation Normal exam - Bowel sounds normal.  Neurologic Neurologic evaluation reveals -alert and oriented x 3 with no impairment of recent or remote memory. Mental Status-Normal.  Musculoskeletal Normal Exam - Left-Upper Extremity Strength Normal and Lower Extremity Strength Normal. Normal Exam - Right-Upper Extremity Strength Normal, Lower Extremity Weakness.    Assessment & Plan Axel Filler(Jammy Stlouis MD; 03/10/2015 1:30 PM) RIGHT INGUINAL HERNIA (K40.90) Impression: 54 year old male with a reducible right inguinal hernia, small, indirect.  1. The patient will like to proceed to the operating room for laparoscopic right inguinal hernia repair.  2. I discussed with the patient the signs and symptoms of incarceration and strangulation and the need to proceed to the ER should they occur.  3. I discussed with the patient the risks and benefits of the procedure to include but not limited to: Infection, bleeding, damage to surrounding structures, possible need for further surgery, possible nerve pain, and possible recurrence.  The patient was understanding and wishes to proceed.

## 2015-03-20 ENCOUNTER — Encounter (HOSPITAL_COMMUNITY): Payer: Self-pay

## 2015-03-20 ENCOUNTER — Encounter (HOSPITAL_COMMUNITY)
Admission: RE | Admit: 2015-03-20 | Discharge: 2015-03-20 | Disposition: A | Discharge status: Home / Self Care | Payer: Worker's Compensation | Source: Ambulatory Visit | Attending: General Surgery | Admitting: General Surgery

## 2015-03-20 DIAGNOSIS — I10 Essential (primary) hypertension: Secondary | ICD-10-CM | POA: Insufficient documentation

## 2015-03-20 DIAGNOSIS — Z01818 Encounter for other preprocedural examination: Secondary | ICD-10-CM | POA: Diagnosis not present

## 2015-03-20 DIAGNOSIS — Z01812 Encounter for preprocedural laboratory examination: Secondary | ICD-10-CM | POA: Insufficient documentation

## 2015-03-20 DIAGNOSIS — K4090 Unilateral inguinal hernia, without obstruction or gangrene, not specified as recurrent: Secondary | ICD-10-CM | POA: Insufficient documentation

## 2015-03-20 HISTORY — DX: Unspecified osteoarthritis, unspecified site: M19.90

## 2015-03-20 HISTORY — DX: Unspecified asthma, uncomplicated: J45.909

## 2015-03-20 LAB — CBC
HEMATOCRIT: 45.1 % (ref 39.0–52.0)
HEMOGLOBIN: 15.6 g/dL (ref 13.0–17.0)
MCH: 31.2 pg (ref 26.0–34.0)
MCHC: 34.6 g/dL (ref 30.0–36.0)
MCV: 90.2 fL (ref 78.0–100.0)
Platelets: 182 10*3/uL (ref 150–400)
RBC: 5 MIL/uL (ref 4.22–5.81)
RDW: 12.8 % (ref 11.5–15.5)
WBC: 6.5 10*3/uL (ref 4.0–10.5)

## 2015-03-20 LAB — BASIC METABOLIC PANEL
ANION GAP: 10 (ref 5–15)
BUN: 13 mg/dL (ref 6–20)
CHLORIDE: 101 mmol/L (ref 101–111)
CO2: 30 mmol/L (ref 22–32)
Calcium: 9.7 mg/dL (ref 8.9–10.3)
Creatinine, Ser: 0.94 mg/dL (ref 0.61–1.24)
GFR calc non Af Amer: >60 mL/min (ref >60)
Glucose, Bld: 93 mg/dL (ref 65–99)
POTASSIUM: 4.3 mmol/L (ref 3.5–5.1)
SODIUM: 141 mmol/L (ref 135–145)

## 2015-03-20 NOTE — Progress Notes (Signed)
   03/20/15 1516  OBSTRUCTIVE SLEEP APNEA  Have you ever been diagnosed with sleep apnea through a sleep study? No  Do you snore loudly (loud enough to be heard through closed doors)?  1  Do you often feel tired, fatigued, or sleepy during the daytime (such as falling asleep during driving or talking to someone)? 0  Has anyone observed you stop breathing during your sleep? 0  Do you have, or are you being treated for high blood pressure? 1  BMI more than 35 kg/m2? 0  Age > 50 (1-yes) 1  Neck circumference greater than:Male 16 inches or larger, Male 17inches or larger? 1  Male Gender (Yes=1) 1  Obstructive Sleep Apnea Score 5  Score 5 or greater  Results sent to PCP

## 2015-03-20 NOTE — Pre-Procedure Instructions (Signed)
    Renita Papahomas C Magar  03/20/2015      [facility] [place], [place] - [id number] [place] [address] Phone: [phone] Fax: [phone]    Your procedure is scheduled on Tuesday, November 8th, 2016.  [facility] at 7:15 A.M.  Call this number if you have problems the morning of surgery:  [phone]   Remember:  Do not eat food or drink liquids after midnight.   Take these medicines the morning of surgery with A SIP OF WATER: Citalopram (Celexa), Fexofenadine (Allegra).  Stop taking: Aspirin, Aleve, Naproxen, NSAIDS, Ibuprofen, Advil, Motrin, BC's, Goody's, Fish oil, all herbal medications, and all vitamins.    Do not wear jewelry.  Do not wear lotions, powders, or colognes.  You may wear deodorant.  Men may shave face and neck.  Do not bring valuables to the hospital.  [facility] is not responsible for any belongings or valuables.  Contacts, dentures or bridgework may not be worn into surgery.  Leave your suitcase in the car.  After surgery it may be brought to your room.  For patients admitted to the hospital, discharge time will be determined by your treatment team.  Patients discharged the day of surgery will not be allowed to drive home.   Special instructions:  See attached.   Please read over the following fact sheets that you were given. Pain Booklet, Coughing and Deep Breathing and Surgical Site Infection Prevention

## 2015-03-24 ENCOUNTER — Ambulatory Visit (HOSPITAL_COMMUNITY): Payer: Worker's Compensation | Admitting: Anesthesiology

## 2015-03-24 ENCOUNTER — Ambulatory Visit (HOSPITAL_COMMUNITY)
Admission: RE | Admit: 2015-03-24 | Discharge: 2015-03-24 | Disposition: A | Discharge status: Home / Self Care | Payer: Worker's Compensation | Source: Ambulatory Visit | Attending: General Surgery | Admitting: General Surgery

## 2015-03-24 ENCOUNTER — Encounter (HOSPITAL_COMMUNITY): Payer: Self-pay | Admitting: Certified Registered Nurse Anesthetist

## 2015-03-24 ENCOUNTER — Encounter (HOSPITAL_COMMUNITY)
Admission: RE | Disposition: A | Discharge status: Home / Self Care | Payer: Self-pay | Source: Ambulatory Visit | Attending: General Surgery

## 2015-03-24 DIAGNOSIS — K4090 Unilateral inguinal hernia, without obstruction or gangrene, not specified as recurrent: Secondary | ICD-10-CM | POA: Diagnosis not present

## 2015-03-24 DIAGNOSIS — Z87891 Personal history of nicotine dependence: Secondary | ICD-10-CM | POA: Insufficient documentation

## 2015-03-24 DIAGNOSIS — K429 Umbilical hernia without obstruction or gangrene: Secondary | ICD-10-CM | POA: Insufficient documentation

## 2015-03-24 HISTORY — PX: INSERTION OF MESH: SHX5868

## 2015-03-24 HISTORY — PX: UMBILICAL HERNIA REPAIR: SHX196

## 2015-03-24 HISTORY — PX: INGUINAL HERNIA REPAIR: SHX194

## 2015-03-24 SURGERY — REPAIR, HERNIA, INGUINAL, LAPAROSCOPIC
Anesthesia: General | Site: Abdomen | Laterality: Right

## 2015-03-24 MED ORDER — MORPHINE SULFATE (PF) 2 MG/ML IV SOLN: 2.0000 mg | INTRAVENOUS | Status: DC | PRN

## 2015-03-24 MED ORDER — SUGAMMADEX SODIUM 200 MG/2ML IV SOLN
INTRAVENOUS | Status: AC
  Filled 2015-03-24: qty 2

## 2015-03-24 MED ORDER — LACTATED RINGERS IV SOLN
INTRAVENOUS | Status: DC
  Administered 2015-03-24: 08:00:00 via INTRAVENOUS

## 2015-03-24 MED ORDER — FENTANYL CITRATE (PF) 250 MCG/5ML IJ SOLN
INTRAMUSCULAR | Status: AC
  Filled 2015-03-24: qty 5

## 2015-03-24 MED ORDER — EPHEDRINE SULFATE 50 MG/ML IJ SOLN
INTRAMUSCULAR | Status: DC | PRN
  Administered 2015-03-24 (×2): 5 mg via INTRAVENOUS

## 2015-03-24 MED ORDER — LIDOCAINE HCL (CARDIAC) 20 MG/ML IV SOLN
INTRAVENOUS | Status: DC | PRN
  Administered 2015-03-24: 80 mg via INTRAVENOUS

## 2015-03-24 MED ORDER — OXYCODONE HCL 5 MG PO TABS
5.0000 mg | ORAL_TABLET | ORAL | Status: DC | PRN
  Administered 2015-03-24: 10 mg via ORAL

## 2015-03-24 MED ORDER — SODIUM CHLORIDE 0.9 % IV SOLN: 250.0000 mL | INTRAVENOUS | Status: DC | PRN

## 2015-03-24 MED ORDER — FENTANYL CITRATE (PF) 100 MCG/2ML IJ SOLN
INTRAMUSCULAR | Status: DC | PRN
  Administered 2015-03-24: 50 ug via INTRAVENOUS

## 2015-03-24 MED ORDER — LACTATED RINGERS IV SOLN: INTRAVENOUS | Status: DC

## 2015-03-24 MED ORDER — ROCURONIUM BROMIDE 100 MG/10ML IV SOLN
INTRAVENOUS | Status: DC | PRN
  Administered 2015-03-24: 50 mg via INTRAVENOUS

## 2015-03-24 MED ORDER — SODIUM CHLORIDE 0.9 % IJ SOLN: 3.0000 mL | Freq: Two times a day (BID) | INTRAMUSCULAR | Status: DC

## 2015-03-24 MED ORDER — BUPIVACAINE HCL 0.25 % IJ SOLN
INTRAMUSCULAR | Status: DC | PRN
  Administered 2015-03-24: 30 mL

## 2015-03-24 MED ORDER — SODIUM CHLORIDE 0.9 % IJ SOLN: 3.0000 mL | INTRAMUSCULAR | Status: DC | PRN

## 2015-03-24 MED ORDER — ACETAMINOPHEN 650 MG RE SUPP: 650.0000 mg | RECTAL | Status: DC | PRN

## 2015-03-24 MED ORDER — OXYCODONE HCL 5 MG PO TABS
ORAL_TABLET | ORAL | Status: AC
  Filled 2015-03-24: qty 2

## 2015-03-24 MED ORDER — SODIUM CHLORIDE 0.9 % IV SOLN
1500.0000 mg | INTRAVENOUS | Status: AC
  Administered 2015-03-24: 1500 mg via INTRAVENOUS
  Filled 2015-03-24 (×2): qty 1500

## 2015-03-24 MED ORDER — MIDAZOLAM HCL 5 MG/5ML IJ SOLN
INTRAMUSCULAR | Status: DC | PRN
  Administered 2015-03-24: 2 mg via INTRAVENOUS

## 2015-03-24 MED ORDER — ONDANSETRON HCL 4 MG/2ML IJ SOLN
INTRAMUSCULAR | Status: DC | PRN
  Administered 2015-03-24: 4 mg via INTRAVENOUS

## 2015-03-24 MED ORDER — 0.9 % SODIUM CHLORIDE (POUR BTL) OPTIME
TOPICAL | Status: DC | PRN
  Administered 2015-03-24: 1000 mL

## 2015-03-24 MED ORDER — BUPIVACAINE HCL (PF) 0.25 % IJ SOLN
INTRAMUSCULAR | Status: AC
  Filled 2015-03-24: qty 30

## 2015-03-24 MED ORDER — MIDAZOLAM HCL 2 MG/2ML IJ SOLN
INTRAMUSCULAR | Status: AC
  Filled 2015-03-24: qty 4

## 2015-03-24 MED ORDER — PROPOFOL 10 MG/ML IV BOLUS
INTRAVENOUS | Status: AC
  Filled 2015-03-24: qty 20

## 2015-03-24 MED ORDER — SUGAMMADEX SODIUM 200 MG/2ML IV SOLN
INTRAVENOUS | Status: DC | PRN
  Administered 2015-03-24: 200 mg via INTRAVENOUS

## 2015-03-24 MED ORDER — PROPOFOL 10 MG/ML IV BOLUS
INTRAVENOUS | Status: DC | PRN
  Administered 2015-03-24: 200 mg via INTRAVENOUS

## 2015-03-24 MED ORDER — LACTATED RINGERS IV SOLN
INTRAVENOUS | Status: DC | PRN
  Administered 2015-03-24 (×2): via INTRAVENOUS

## 2015-03-24 MED ORDER — MEPERIDINE HCL 25 MG/ML IJ SOLN: 6.2500 mg | INTRAMUSCULAR | Status: DC | PRN

## 2015-03-24 MED ORDER — HYDROMORPHONE HCL 1 MG/ML IJ SOLN
INTRAMUSCULAR | Status: AC
  Filled 2015-03-24: qty 1

## 2015-03-24 MED ORDER — CHLORHEXIDINE GLUCONATE 4 % EX LIQD: 1.0000 "application " | Freq: Once | CUTANEOUS | Status: DC

## 2015-03-24 MED ORDER — HYDROMORPHONE HCL 1 MG/ML IJ SOLN
0.2500 mg | INTRAMUSCULAR | Status: DC | PRN
  Administered 2015-03-24 (×2): 0.5 mg via INTRAVENOUS

## 2015-03-24 MED ORDER — OXYCODONE-ACETAMINOPHEN 5-325 MG PO TABS: 1.0000 | ORAL_TABLET | ORAL | Status: AC | PRN

## 2015-03-24 MED ORDER — ACETAMINOPHEN 325 MG PO TABS: 650.0000 mg | ORAL_TABLET | ORAL | Status: DC | PRN

## 2015-03-24 MED ORDER — PROMETHAZINE HCL 25 MG/ML IJ SOLN: 6.2500 mg | INTRAMUSCULAR | Status: DC | PRN

## 2015-03-24 SURGICAL SUPPLY — 50 items
APL SKNCLS STERI-STRIP NONHPOA (GAUZE/BANDAGES/DRESSINGS) ×2
APPLIER CLIP 5 13 M/L LIGAMAX5 (MISCELLANEOUS) ×3
APR CLP MED LRG 5 ANG JAW (MISCELLANEOUS) ×2
BENZOIN TINCTURE PRP APPL 2/3 (GAUZE/BANDAGES/DRESSINGS) ×3 IMPLANT
CANISTER SUCTION 2500CC (MISCELLANEOUS) IMPLANT
CHLORAPREP W/TINT 26ML (MISCELLANEOUS) ×3 IMPLANT
CLIP APPLIE 5 13 M/L LIGAMAX5 (MISCELLANEOUS) IMPLANT
COVER SURGICAL LIGHT HANDLE (MISCELLANEOUS) ×3 IMPLANT
DISSECTOR BLUNT TIP ENDO 5MM (MISCELLANEOUS) IMPLANT
ELECT CAUTERY BLADE 6.4 (BLADE) ×1 IMPLANT
ELECT REM PT RETURN 9FT ADLT (ELECTROSURGICAL) ×3
ELECTRODE REM PT RTRN 9FT ADLT (ELECTROSURGICAL) ×2 IMPLANT
GAUZE SPONGE 2X2 8PLY STRL LF (GAUZE/BANDAGES/DRESSINGS) ×2 IMPLANT
GLOVE BIO SURGEON STRL SZ7.5 (GLOVE) ×3 IMPLANT
GLOVE BIOGEL PI IND STRL 7.5 (GLOVE) IMPLANT
GLOVE BIOGEL PI INDICATOR 7.5 (GLOVE) ×1
GLOVE ECLIPSE 7.5 STRL STRAW (GLOVE) ×1 IMPLANT
GLOVE SURG SS PI 7.5 STRL IVOR (GLOVE) ×2 IMPLANT
GOWN STRL REUS W/ TWL LRG LVL3 (GOWN DISPOSABLE) ×4 IMPLANT
GOWN STRL REUS W/ TWL XL LVL3 (GOWN DISPOSABLE) ×2 IMPLANT
GOWN STRL REUS W/TWL LRG LVL3 (GOWN DISPOSABLE) ×6
GOWN STRL REUS W/TWL XL LVL3 (GOWN DISPOSABLE) ×3
KIT BASIN OR (CUSTOM PROCEDURE TRAY) ×3 IMPLANT
KIT ROOM TURNOVER OR (KITS) ×3 IMPLANT
MESH 3DMAX 4X6 RT LRG (Mesh General) ×1 IMPLANT
NDL INSUFFLATION 14GA 120MM (NEEDLE) IMPLANT
NEEDLE INSUFFLATION 14GA 120MM (NEEDLE) IMPLANT
NS IRRIG 1000ML POUR BTL (IV SOLUTION) ×3 IMPLANT
PAD ARMBOARD 7.5X6 YLW CONV (MISCELLANEOUS) ×6 IMPLANT
PENCIL BUTTON HOLSTER BLD 10FT (ELECTRODE) ×1 IMPLANT
RELOAD STAPLE 4.0 BLU F/HERNIA (INSTRUMENTS) ×2 IMPLANT
RELOAD STAPLE 4.8 BLK F/HERNIA (STAPLE) IMPLANT
RELOAD STAPLE HERNIA 4.0 BLUE (INSTRUMENTS) ×3 IMPLANT
RELOAD STAPLE HERNIA 4.8 BLK (STAPLE) IMPLANT
SCISSORS LAP 5X35 DISP (ENDOMECHANICALS) ×3 IMPLANT
SET IRRIG TUBING LAPAROSCOPIC (IRRIGATION / IRRIGATOR) IMPLANT
SET TROCAR LAP APPLE-HUNT 5MM (ENDOMECHANICALS) ×3 IMPLANT
SPONGE GAUZE 2X2 STER 10/PKG (GAUZE/BANDAGES/DRESSINGS) ×1
STAPLER HERNIA 12 8.5 360D (INSTRUMENTS) ×3 IMPLANT
STRIP CLOSURE SKIN 1/2X4 (GAUZE/BANDAGES/DRESSINGS) ×3 IMPLANT
SUT ETHIBOND CT1 BRD #0 30IN (SUTURE) ×1 IMPLANT
SUT MNCRL AB 4-0 PS2 18 (SUTURE) ×4 IMPLANT
SUT VIC AB 1 CT1 27 (SUTURE)
SUT VIC AB 1 CT1 27XBRD ANBCTR (SUTURE) IMPLANT
TOWEL OR 17X24 6PK STRL BLUE (TOWEL DISPOSABLE) ×3 IMPLANT
TOWEL OR 17X26 10 PK STRL BLUE (TOWEL DISPOSABLE) ×3 IMPLANT
TRAY FOLEY CATH 16FR SILVER (SET/KITS/TRAYS/PACK) ×3 IMPLANT
TRAY LAPAROSCOPIC MC (CUSTOM PROCEDURE TRAY) ×3 IMPLANT
TROCAR XCEL 12X100 BLDLESS (ENDOMECHANICALS) ×3 IMPLANT
TUBING INSUFFLATION (TUBING) ×3 IMPLANT

## 2015-03-24 NOTE — Anesthesia Postprocedure Evaluation (Signed)
  Anesthesia Post-op Note  Patient: Bryce Rush  Procedure(s) Performed: Procedure(s): LAPAROSCOPIC INGUINAL HERNIA WITH MESH (Right) INSERTION OF MESH (Right) LARARASCOPIC HERNIA REPAIR UMBILICAL ADULT (N/A)  Patient Location: PACU  Anesthesia Type:General  Level of Consciousness: awake and alert   Airway and Oxygen Therapy: Patient Spontanous Breathing  Post-op Pain: mild  Post-op Assessment: Post-op Vital signs reviewed              Post-op Vital Signs: Reviewed  Last Vitals:  Filed Vitals:   03/24/15 1245  BP: 120/64  Pulse: 88  Temp: 36.3 C  Resp:     Complications: No apparent anesthesia complications

## 2015-03-24 NOTE — Transfer of Care (Signed)
Immediate Anesthesia Transfer of Care Note  Patient: Bryce Rush  Procedure(s) Performed: Procedure(s): LAPAROSCOPIC INGUINAL HERNIA WITH MESH (Right) INSERTION OF MESH (Right) LARARASCOPIC HERNIA REPAIR UMBILICAL ADULT (N/A)  Patient Location: PACU  Anesthesia Type:General  Level of Consciousness: awake, alert , patient cooperative and responds to stimulation  Airway & Oxygen Therapy: Patient Spontanous Breathing and Patient connected to nasal cannula oxygen  Post-op Assessment: Report given to RN, Post -op Vital signs reviewed and stable and Patient moving all extremities X 4  Post vital signs: Reviewed and stable  Last Vitals:  Filed Vitals:   03/24/15 0746  BP: 144/72  Pulse: 66  Temp: 36.7 C  Resp: 20    Complications: No apparent anesthesia complications

## 2015-03-24 NOTE — Discharge Instructions (Signed)
[facility] _______[facility] ° °HERNIA REPAIR: POST OP INSTRUCTIONS ° °Always review your discharge instruction sheet given to you by the facility where your surgery was performed. °IF YOU HAVE DISABILITY OR FAMILY LEAVE FORMS, YOU MUST BRING THEM TO THE OFFICE FOR PROCESSING.   °DO NOT GIVE THEM TO YOUR DOCTOR. ° °1. A  prescription for pain medication may be given to you upon discharge.  Take your pain medication as prescribed, if needed.  If narcotic pain medicine is not needed, then you may take acetaminophen (Tylenol) or ibuprofen (Advil) as needed. °2. Take your usually prescribed medications unless otherwise directed. °3. If you need a refill on your pain medication, please contact your pharmacy.  They will contact our office to request authorization. Prescriptions will not be filled after 5 pm or on week-ends. °4. You should follow a light diet the first 24 hours after arrival home, such as soup and crackers, etc.  Be sure to include lots of fluids daily.  Resume your normal diet the day after surgery. °5. Most patients will experience some swelling and bruising around the umbilicus or in the groin and scrotum.  Ice packs and reclining will help.  Swelling and bruising can take several days to resolve.  °6. It is common to experience some constipation if taking pain medication after surgery.  Increasing fluid intake and taking a stool softener (such as Colace) will usually help or prevent this problem from occurring.  A mild laxative (Milk of Magnesia or Miralax) should be taken according to package directions if there are no bowel movements after 48 hours. °7. Unless discharge instructions indicate otherwise, you may remove your bandages 24-48 hours after surgery, and you may shower at that time.  You may have steri-strips (small skin tapes) in place directly over the incision.  These strips should be left on the skin for 7-10 days.  If your surgeon used skin glue on the incision, you may shower  in 24 hours.  The glue will flake off over the next 2-3 weeks.  Any sutures or staples will be removed at the office during your follow-up visit. °8. ACTIVITIES:  You may resume regular (light) daily activities beginning the next day--such as daily self-care, walking, climbing stairs--gradually increasing activities as tolerated.  You may have sexual intercourse when it is comfortable.  Refrain from any heavy lifting or straining until approved by your doctor. °a. You may drive when you are no longer taking prescription pain medication, you can comfortably wear a seatbelt, and you can safely maneuver your car and apply brakes. °b. RETURN TO WORK:  __________________________________________________________ °9. You should see your doctor in the office for a follow-up appointment approximately 2-3 weeks after your surgery.  Make sure that you call for this appointment within a day or two after you arrive home to insure a convenient appointment time. °10. OTHER INSTRUCTIONS:  __________________________________________________________________________________________________________________________________________________________________________________________  °WHEN TO CALL YOUR DOCTOR: °1. Fever over 101.0 °2. Inability to urinate °3. Nausea and/or vomiting °4. Extreme swelling or bruising °5. Continued bleeding from incision. °6. Increased pain, redness, or drainage from the incision ° °The clinic staff is available to answer your questions during regular business hours.  Please don’t hesitate to call and ask to speak to one of the nurses for clinical concerns.  If you have a medical emergency, go to the nearest emergency room or call 911.  A surgeon from [facility] is always on call at the hospital ° ° °[address], [address], [place], [place]  [address] ? ° [address] [address], [place], [place]   [address] °[phone] ? [phone] ? FAX [phone] °Web site: [email or web address] ° °

## 2015-03-24 NOTE — H&P (View-Only) (Signed)
History of Present Illness Bryce Filler MD; 03/10/2015 1:30 PM) Patient words: Evaluate right inguinal hernia.  The patient is a 54 year old male who presents with an inguinal hernia. The patient is a 54 year old male who is referred by Kathrine Haddock, M.D. for an evaluation of right inguinal hernia. Patient states that he had some right inguinal pain and noticed a small bulge. The patient works at a [employer]. 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 The patient had been referred for workers comp.   Other Problems Maryan Puls, CMA; 03/10/2015 1:18 PM) Anxiety Disorder Depression Diverticulosis High blood pressure  Past Surgical History Maryan Puls, CMA; 03/10/2015 1:18 PM) Shoulder Surgery Right.  Diagnostic Studies History Maryan Puls, [place]; 03/10/2015 1:18 PM) Colonoscopy 5-10 years ago  Allergies Maryan Puls, CMA; 03/10/2015 1:19 PM) Penicillin V *PENICILLINS* SulfADIAZINE *Sulfonamides  Medication History Maryan Puls, CMA; 03/10/2015 1:20 PM) Citalopram Hydrobromide (  Tablet, Oral) Active. Lisinopril (  Tablet, Oral) Active. Nitrostat (0.4MG  Tab Sublingual, Sublingual) Active. Multi Vitamin Daily (Oral) Active.  Social History Maryan Puls, [place]; 03/10/2015 1:18 PM) Alcohol use Moderate alcohol use. Caffeine use Coffee. Illicit drug use Remotely quit drug use. Tobacco use Former smoker.  Family History Maryan Puls, [place]; 03/10/2015 1:18 PM) Arthritis Father, Mother. Depression Father, Mother. Heart Disease Father. Hypertension Father, Mother. Respiratory Condition Mother. Thyroid problems Father.    Review of Systems Maryan Puls CMA; 03/10/2015 1:18 PM) General Not Present- Appetite Loss, Chills, Fatigue, Fever, Night Sweats, Weight Gain and Weight Loss. Skin Present- Rash. Not Present- Change in Wart/Mole, Dryness, Hives, Jaundice, New Lesions, Non-Healing Wounds and Ulcer. HEENT Present-  Seasonal Allergies and Wears glasses/contact lenses. Not Present- Earache, Hearing Loss, Hoarseness, Nose Bleed, Oral Ulcers, Ringing in the Ears, Sinus Pain, Sore Throat, Visual Disturbances and Yellow Eyes. Respiratory Present- Snoring. Not Present- Bloody sputum, Chronic Cough, Difficulty Breathing and Wheezing. Breast Not Present- Breast Mass, Breast Pain, Nipple Discharge and Skin Changes. Cardiovascular Not Present- Chest Pain, Difficulty Breathing Lying Down, Leg Cramps, Palpitations, Rapid Heart Rate, Shortness of Breath and Swelling of Extremities. Gastrointestinal Not Present- Abdominal Pain, Bloating, Bloody Stool, Change in Bowel Habits, Chronic diarrhea, Constipation, Difficulty Swallowing, Excessive gas, Gets full quickly at meals, Hemorrhoids, Indigestion, Nausea, Rectal Pain and Vomiting. Male Genitourinary Not Present- Blood in Urine, Change in Urinary Stream, Frequency, Impotence, Nocturia, Painful Urination, Urgency and Urine Leakage. Musculoskeletal Not Present- Back Pain, Joint Pain, Joint Stiffness, Muscle Pain, Muscle Weakness and Swelling of Extremities. Neurological Not Present- Decreased Memory, Fainting, Headaches, Numbness, Seizures, Tingling, Tremor, Trouble walking and Weakness. Psychiatric Present- Depression. Not Present- Anxiety, Bipolar, Change in Sleep Pattern, Fearful and Frequent crying. Endocrine Not Present- Cold Intolerance, Excessive Hunger, Hair Changes, Heat Intolerance, Hot flashes and New Diabetes. Hematology Not Present- Easy Bruising, Excessive bleeding, Gland problems, HIV and Persistent Infections.  Vitals Maryan Puls CMA; 03/10/2015 1:18 PM) 03/10/2015 1:18 PM Weight: 218 lb Height: 69in Body Surface Area: 2.14 m Body Mass Index: 32.19 kg/m  Temp.: 97.27F(Temporal)  Pulse: 76 (Regular)  Resp.: 16 (Unlabored)  BP: 118/84 (Sitting, Left Arm, Standard)       Physical Exam Bryce Filler, MD; 03/10/2015 1:30  PM) General Mental Status-Alert. General Appearance-Consistent with stated age. Hydration-Well hydrated. Voice-Normal.  Head and Neck Head-normocephalic, atraumatic with no lesions or palpable masses. Trachea-midline.  Eye Eyeball - Bilateral-Extraocular movements intact. Sclera/Conjunctiva - Bilateral-No scleral icterus.  Chest and Lung Exam Chest and lung exam reveals -quiet, even and easy respiratory effort with no use of accessory muscles. Inspection  Chest Wall - Normal. Back - normal.  Cardiovascular Cardiovascular examination reveals -normal heart sounds, regular rate and rhythm with no murmurs.  Abdomen Inspection Skin - Scar - no surgical scars. Hernias - Inguinal hernia - Right - Reducible. Palpation/Percussion Normal exam - Soft, Non Tender, No Rebound tenderness, No Rigidity (guarding) and No hepatosplenomegaly. Auscultation Normal exam - Bowel sounds normal.  Neurologic Neurologic evaluation reveals -alert and oriented x 3 with no impairment of recent or remote memory. Mental Status-Normal.  Musculoskeletal Normal Exam - Left-Upper Extremity Strength Normal and Lower Extremity Strength Normal. Normal Exam - Right-Upper Extremity Strength Normal, Lower Extremity Weakness.    Assessment & Plan Bryce Rush(Eman Rynders MD; 03/10/2015 1:30 PM) RIGHT INGUINAL HERNIA (K40.90) Impression: 54 year old male with a reducible right inguinal hernia, small, indirect.  1. The patient will like to proceed to the operating room for laparoscopic right inguinal hernia repair.  2. I discussed with the patient the signs and symptoms of incarceration and strangulation and the need to proceed to the ER should they occur.  3. I discussed with the patient the risks and benefits of the procedure to include but not limited to: Infection, bleeding, damage to surrounding structures, possible need for further surgery, possible nerve pain, and possible recurrence.  The patient was understanding and wishes to proceed.

## 2015-03-24 NOTE — Anesthesia Procedure Notes (Signed)
Procedure Name: Intubation Date/Time: 03/24/2015 9:46 AM Performed by: Virgel GessHOLTZMAN, Antwone Capozzoli LEFFEW Pre-anesthesia Checklist: Patient identified, Patient being monitored, Timeout performed, Emergency Drugs available and Suction available Patient Re-evaluated:Patient Re-evaluated prior to inductionOxygen Delivery Method: Circle System Utilized Preoxygenation: Pre-oxygenation with 100% oxygen Intubation Type: IV induction Ventilation: Mask ventilation without difficulty Laryngoscope Size: Mac and 3 Grade View: Grade I Tube type: Oral Tube size: 7.5 mm Number of attempts: 1 Airway Equipment and Method: Stylet Placement Confirmation: ETT inserted through vocal cords under direct vision,  positive ETCO2 and breath sounds checked- equal and bilateral Secured at: 23 cm Tube secured with: Tape Dental Injury: Teeth and Oropharynx as per pre-operative assessment

## 2015-03-24 NOTE — Anesthesia Preprocedure Evaluation (Addendum)
Anesthesia Evaluation  Patient identified by MRN, date of birth, ID band Patient awake    Reviewed: Allergy & Precautions, NPO status , Patient's Chart, lab work & pertinent test results  Airway Mallampati: I  TM Distance: >3 FB Neck ROM: Full    Dental  (+) Teeth Intact, Dental Advisory Given   Pulmonary former smoker,    breath sounds clear to auscultation       Cardiovascular hypertension, Pt. on medications  Rhythm:Regular Rate:Normal     Neuro/Psych  Headaches, PSYCHIATRIC DISORDERS Anxiety Depression    GI/Hepatic Neg liver ROS, GERD  ,  Endo/Other  negative endocrine ROS  Renal/GU negative Renal ROS  negative genitourinary   Musculoskeletal  (+) Arthritis ,   Abdominal   Peds negative pediatric ROS (+)  Hematology negative hematology ROS (+)   Anesthesia Other Findings   Reproductive/Obstetrics negative OB ROS                          Lab Results  Component Value Date   WBC 6.5 03/20/2015   HGB 15.6 03/20/2015   HCT 45.1 03/20/2015   MCV 90.2 03/20/2015   PLT 182 03/20/2015   Lab Results  Component Value Date   CREATININE 0.94 03/20/2015   BUN 13 03/20/2015   NA 141 03/20/2015   K 4.3 03/20/2015   CL 101 03/20/2015   CO2 30 03/20/2015   No results found for: INR, PROTIME  EKG: normal EKG, normal sinus rhythm.   Anesthesia Physical Anesthesia Plan  ASA: III  Anesthesia Plan: General   Post-op Pain Management:    Induction: Intravenous  Airway Management Planned: Oral ETT  Additional Equipment:   Intra-op Plan:   Post-operative Plan: Extubation in OR  Informed Consent: I have reviewed the patients History and Physical, chart, labs and discussed the procedure including the risks, benefits and alternatives for the proposed anesthesia with the patient or authorized representative who has indicated his/her understanding and acceptance.   Dental advisory  given  Plan Discussed with:   Anesthesia Plan Comments: (Mr. Ermalene SearingBedsole would not like TAP block at this time. )       Anesthesia Quick Evaluation

## 2015-03-24 NOTE — Op Note (Signed)
03/24/2015  10:35 AM  PATIENT:  Bryce Rush  54 y.o. male  PRE-OPERATIVE DIAGNOSIS:  RIGHT INGUINAL HERNIA and UMBILICAL HERNIA  POST-OPERATIVE DIAGNOSIS:  RIGHT INGUINAL HERNIA AND UMBILICAL HERNIA  PROCEDURE:  Procedure(s): LAPAROSCOPIC INGUINAL HERNIA WITH MESH (Right) INSERTION OF MESH (Right) PRIMARY UMBILICAL HERNIA REPAIR (N/A)  SURGEON:  Surgeon(s) and Role:    * Axel FillerArmando Devaney Segers, MD - Primary   ANESTHESIA:   local and general  EBL:     BLOOD ADMINISTERED:none  DRAINS: none   LOCAL MEDICATIONS USED:  BUPIVICAINE   SPECIMEN:  No Specimen  DISPOSITION OF SPECIMEN:  N/A  COUNTS:  YES  TOURNIQUET:  * No tourniquets in log *  DICTATION: .Dragon Dictation   Counts: reported as correct x 2  Findings:  The patient had a small right indirect hernia and cord lipoma and primary umbilical hernia  Indications for procedure:  The patient is a 54 year old male with a right hernia for several months. Patient complained of symptomatology to his right inguinal area and umbilical hernia. The patient was taken back for elective inguinal and umbilical hernia repair.  Details of the procedure: The patient was taken back to the operating room. The patient was placed in supine position with bilateral SCDs in place.  The patient was prepped and draped in the usual sterile fashion.  After appropriate anitbiotics were confirmed, a time-out was confirmed and all facts were verified.  0.25% Marcaine was used to infiltrate the umbilical area. A 11-blade was used to cut down the skin and blunt dissection was used to get the anterior fashion.  The anterior fascia was incised approximately 1 cm and the muscles were retracted laterally. Blunt dissection was then used to create a space in the preperitoneal area. At this time a 10 mm camera was then introduced into the space and advanced the pubic tubercle and a 12 mm trocar was placed over this and insufflation was started.  At this time and  space was created from medial to laterally the preperitoneal space.  Cooper's ligament was initially cleaned off.  The hernia sac was identified in the right indirect space. Dissection of the hernia sac was undertaken the vas deferens was identified and protected in all parts of the case.  The cord lipoma was also dissected back.  Once the hernia sac was taken down to approximately the umbilicus a Bard 3D Max mesh, size: Large, was  introduced into the preperitoneal space.  The mesh was brought over to cover the direct and indirect hernia spaces.  This was anchored into place and secured to Cooper's ligament with 4.580mm staples from a [place] hernia stapler. It was anchored to the anterior abdominal wall with 4.8 mm staples. The hernia sac was seen lying posterior to the mesh. There was no staples placed laterally. The insufflation was evacuated and the peritoneum was seen posterior to the mesh. The trochars were removed. The anterior fascia was reapproximated using #1 Vicryl on a UR- 6.     The umbilical stalk was taken off the abdominal wall.  The primary hernia was seen.  The fascia was cleared circumfrentially approximately 2cm.  0-Ethibonds were than used to reapproximate the fascia in an interrupted fashion.  The umbilical stalk was reattached to the fascia using an 0 Vicryl x 1.  The skin was reapproximated using 4-0 Monocryl subcuticular fashion the patient was awakened from general anesthesia and taken to recovery in stable condition.   PLAN OF CARE: Discharge to home after PACU  PATIENT DISPOSITION:  PACU - hemodynamically stable.   Delay start of Pharmacological VTE agent (>24hrs) due to surgical blood loss or risk of bleeding: not applicable

## 2015-03-24 NOTE — Interval H&P Note (Signed)
History and Physical Interval Note:  03/24/2015 7:25 AM  Bryce Rush  has presented today for surgery, with the diagnosis of RIGHT INGUINAL HERNIA  The various methods of treatment have been discussed with the patient and family. After consideration of risks, benefits and other options for treatment, the patient has consented to  Procedure(s): LAPAROSCOPIC INGUINAL HERNIA WITH MESH (Right) INSERTION OF MESH (Right) as a surgical intervention .  The patient's history has been reviewed, patient examined, no change in status, stable for surgery.  I have reviewed the patient's chart and labs.  Questions were answered to the patient's satisfaction.     Marigene Ehlersamirez Jr., Jed LimerickArmando

## 2015-03-25 ENCOUNTER — Encounter (HOSPITAL_COMMUNITY): Payer: Self-pay | Admitting: General Surgery

## 2016-01-14 ENCOUNTER — Ambulatory Visit (INDEPENDENT_AMBULATORY_CARE_PROVIDER_SITE_OTHER): Payer: Self-pay

## 2016-01-14 ENCOUNTER — Ambulatory Visit (INDEPENDENT_AMBULATORY_CARE_PROVIDER_SITE_OTHER): Payer: Self-pay | Admitting: Family Medicine

## 2016-01-14 ENCOUNTER — Encounter: Payer: Self-pay | Admitting: Family Medicine

## 2016-01-14 VITALS — BP 122/70 | HR 62 | Temp 98.0°F | Resp 16 | Ht 69.75 in | Wt 211.8 lb

## 2016-01-14 DIAGNOSIS — M545 Low back pain, unspecified: Secondary | ICD-10-CM

## 2016-01-14 MED ORDER — METHOCARBAMOL 500 MG PO TABS: 500.0000 mg | ORAL_TABLET | Freq: Four times a day (QID) | ORAL | 0 refills | Status: AC

## 2016-01-14 NOTE — Patient Instructions (Addendum)
   IF you received an x-ray today, you will receive an invoice from [facility]. Please contact [facility] at [phone] with questions or concerns regarding your invoice.   IF you received labwork today, you will receive an invoice from [facility]. Please contact [facility] at [phone] with questions or concerns regarding your invoice.   Our billing staff will not be able to assist you with questions regarding bills from these companies.  You will be contacted with the lab results as soon as they are available. The fastest way to get your results is to activate your My Chart account. Instructions are located on the last page of this paperwork. If you have not heard from us regarding the results in 2 weeks, please contact this office.     Low Back Sprain With Rehab A sprain is an injury in which a ligament is torn. The ligaments of the lower back are vulnerable to sprains. However, they are strong and require great force to be injured. These ligaments are important for stabilizing the spinal column. Sprains are classified into three categories. Grade 1 sprains cause pain, but the tendon is not lengthened. Grade 2 sprains include a lengthened ligament, due to the ligament being stretched or partially ruptured. With grade 2 sprains there is still function, although the function may be decreased. Grade 3 sprains involve a complete tear of the tendon or muscle, and function is usually impaired. SYMPTOMS   Severe pain in the lower back.  Sometimes, a feeling of a "pop," "snap," or tear, at the time of injury.  Tenderness and sometimes swelling at the injury site.  Uncommonly, bruising (contusion) within 48 hours of injury.  Muscle spasms in the back. CAUSES  Low back sprains occur when a force is placed on the ligaments that is greater than they can handle. Common causes of injury include:  Performing a stressful act while  off-balance.  Repetitive stressful activities that involve movement of the lower back.  Direct hit (trauma) to the lower back. RISK INCREASES WITH:  Contact sports (football, wrestling).  Collisions (major skiing accidents).  Sports that require throwing or lifting (baseball, weightlifting).  Sports involving twisting of the spine (gymnastics, diving, tennis, golf).  Poor strength and flexibility.  Inadequate protection.  Previous back injury or surgery (especially fusion). PREVENTION  Wear properly fitted and padded protective equipment.  Warm up and stretch properly before activity.  Allow for adequate recovery between workouts.  Maintain physical fitness:  Strength, flexibility, and endurance.  Cardiovascular fitness.  Maintain a healthy body weight. PROGNOSIS  If treated properly, low back sprains usually heal with non-surgical treatment. The length of time for healing depends on the severity of the injury.  RELATED COMPLICATIONS   Recurring symptoms, resulting in a chronic problem.  Chronic inflammation and pain in the low back.  Delayed healing or resolution of symptoms, especially if activity is resumed too soon.  Prolonged impairment.  Unstable or arthritic joints of the low back. TREATMENT  Treatment first involves the use of ice and medicine, to reduce pain and inflammation. The use of strengthening and stretching exercises may help reduce pain with activity. These exercises may be performed at home or with a therapist. Severe injuries may require referral to a therapist for further evaluation and treatment, such as ultrasound. Your caregiver may advise that you wear a back brace or corset, to help reduce pain and discomfort. Often, prolonged bed rest results in greater harm then benefit. Corticosteroid injections may   be recommended. However, these should be reserved for the most serious cases. It is important to avoid using your back when lifting objects.  At night, sleep on your back on a firm mattress, with a pillow placed under your knees. If non-surgical treatment is unsuccessful, surgery may be needed.  MEDICATION   If pain medicine is needed, nonsteroidal anti-inflammatory medicines (aspirin and ibuprofen), or other minor pain relievers (acetaminophen), are often advised.  Do not take pain medicine for 7 days before surgery.  Prescription pain relievers may be given, if your caregiver thinks they are needed. Use only as directed and only as much as you need.  Ointments applied to the skin may be helpful.  Corticosteroid injections may be given by your caregiver. These injections should be reserved for the most serious cases, because they may only be given a certain number of times. HEAT AND COLD  Cold treatment (icing) should be applied for 10 to 15 minutes every 2 to 3 hours for inflammation and pain, and immediately after activity that aggravates your symptoms. Use ice packs or an ice massage.  Heat treatment may be used before performing stretching and strengthening activities prescribed by your caregiver, physical therapist, or athletic trainer. Use a heat pack or a warm water soak. SEEK MEDICAL CARE IF:   Symptoms get worse or do not improve in 2 to 4 weeks, despite treatment.  You develop numbness or weakness in either leg.  You lose bowel or bladder function.  Any of the following occur after surgery: fever, increased pain, swelling, redness, drainage of fluids, or bleeding in the affected area.  New, unexplained symptoms develop. (Drugs used in treatment may produce side effects.) EXERCISES  RANGE OF MOTION (ROM) AND STRETCHING EXERCISES - Low Back Sprain Most people with lower back pain will find that their symptoms get worse with excessive bending forward (flexion) or arching at the lower back (extension). The exercises that will help resolve your symptoms will focus on the opposite motion.  Your physician, physical  therapist or athletic trainer will help you determine which exercises will be most helpful to resolve your lower back pain. Do not complete any exercises without first consulting with your caregiver. Discontinue any exercises which make your symptoms worse, until you speak to your caregiver. If you have pain, numbness or tingling which travels down into your buttocks, leg or foot, the goal of the therapy is for these symptoms to move closer to your back and eventually resolve. Sometimes, these leg symptoms will get better, but your lower back pain may worsen. This is often an indication of progress in your rehabilitation. Be very alert to any changes in your symptoms and the activities in which you participated in the 24 hours prior to the change. Sharing this information with your caregiver will allow him or her to most efficiently treat your condition. These exercises may help you when beginning to rehabilitate your injury. Your symptoms may resolve with or without further involvement from your physician, physical therapist or athletic trainer. While completing these exercises, remember:   Restoring tissue flexibility helps normal motion to return to the joints. This allows healthier, less painful movement and activity.  An effective stretch should be held for at least 30 seconds.  A stretch should never be painful. You should only feel a gentle lengthening or release in the stretched tissue. FLEXION RANGE OF MOTION AND STRETCHING EXERCISES: STRETCH - Flexion, Single Knee to Chest   Lie on a firm bed or floor with   both legs extended in front of you.  Keeping one leg in contact with the floor, bring your opposite knee to your chest. Hold your leg in place by either grabbing behind your thigh or at your knee.  Pull until you feel a gentle stretch in your low back. Hold __________ seconds.  Slowly release your grasp and repeat the exercise with the opposite side. Repeat __________ times. Complete  this exercise __________ times per day.  STRETCH - Flexion, Double Knee to Chest  Lie on a firm bed or floor with both legs extended in front of you.  Keeping one leg in contact with the floor, bring your opposite knee to your chest.  Tense your stomach muscles to support your back and then lift your other knee to your chest. Hold your legs in place by either grabbing behind your thighs or at your knees.  Pull both knees toward your chest until you feel a gentle stretch in your low back. Hold __________ seconds.  Tense your stomach muscles and slowly return one leg at a time to the floor. Repeat __________ times. Complete this exercise __________ times per day.  STRETCH - Low Trunk Rotation  Lie on a firm bed or floor. Keeping your legs in front of you, bend your knees so they are both pointed toward the ceiling and your feet are flat on the floor.  Extend your arms out to the side. This will stabilize your upper body by keeping your shoulders in contact with the floor.  Gently and slowly drop both knees together to one side until you feel a gentle stretch in your low back. Hold for __________ seconds.  Tense your stomach muscles to support your lower back as you bring your knees back to the starting position. Repeat the exercise to the other side. Repeat __________ times. Complete this exercise __________ times per day  EXTENSION RANGE OF MOTION AND FLEXIBILITY EXERCISES: STRETCH - Extension, Prone on Elbows   Lie on your stomach on the floor, a bed will be too soft. Place your palms about shoulder width apart and at the height of your head.  Place your elbows under your shoulders. If this is too painful, stack pillows under your chest.  Allow your body to relax so that your hips drop lower and make contact more completely with the floor.  Hold this position for __________ seconds.  Slowly return to lying flat on the floor. Repeat __________ times. Complete this exercise  __________ times per day.  RANGE OF MOTION - Extension, Prone Press Ups  Lie on your stomach on the floor, a bed will be too soft. Place your palms about shoulder width apart and at the height of your head.  Keeping your back as relaxed as possible, slowly straighten your elbows while keeping your hips on the floor. You may adjust the placement of your hands to maximize your comfort. As you gain motion, your hands will come more underneath your shoulders.  Hold this position __________ seconds.  Slowly return to lying flat on the floor. Repeat __________ times. Complete this exercise __________ times per day.  RANGE OF MOTION- Quadruped, Neutral Spine   Assume a hands and knees position on a firm surface. Keep your hands under your shoulders and your knees under your hips. You may place padding under your knees for comfort.  Drop your head and point your tailbone toward the ground below you. This will round out your lower back like an angry cat. Hold this position   for __________ seconds.  Slowly lift your head and release your tail bone so that your back sags into a large arch, like an old horse.  Hold this position for __________ seconds.  Repeat this until you feel limber in your low back.  Now, find your "sweet spot." This will be the most comfortable position somewhere between the two previous positions. This is your neutral spine. Once you have found this position, tense your stomach muscles to support your low back.  Hold this position for __________ seconds. Repeat __________ times. Complete this exercise __________ times per day.  STRENGTHENING EXERCISES - Low Back Sprain These exercises may help you when beginning to rehabilitate your injury. These exercises should be done near your "sweet spot." This is the neutral, low-back arch, somewhere between fully rounded and fully arched, that is your least painful position. When performed in this safe range of motion, these exercises  can be used for people who have either a flexion or extension based injury. These exercises may resolve your symptoms with or without further involvement from your physician, physical therapist or athletic trainer. While completing these exercises, remember:   Muscles can gain both the endurance and the strength needed for everyday activities through controlled exercises.  Complete these exercises as instructed by your physician, physical therapist or athletic trainer. Increase the resistance and repetitions only as guided.  You may experience muscle soreness or fatigue, but the pain or discomfort you are trying to eliminate should never worsen during these exercises. If this pain does worsen, stop and make certain you are following the directions exactly. If the pain is still present after adjustments, discontinue the exercise until you can discuss the trouble with your caregiver. STRENGTHENING - Deep Abdominals, Pelvic Tilt   Lie on a firm bed or floor. Keeping your legs in front of you, bend your knees so they are both pointed toward the ceiling and your feet are flat on the floor.  Tense your lower abdominal muscles to press your low back into the floor. This motion will rotate your pelvis so that your tail bone is scooping upwards rather than pointing at your feet or into the floor. With a gentle tension and even breathing, hold this position for __________ seconds. Repeat __________ times. Complete this exercise __________ times per day.  STRENGTHENING - Abdominals, Crunches   Lie on a firm bed or floor. Keeping your legs in front of you, bend your knees so they are both pointed toward the ceiling and your feet are flat on the floor. Cross your arms over your chest.  Slightly tip your chin down without bending your neck.  Tense your abdominals and slowly lift your trunk high enough to just clear your shoulder blades. Lifting higher can put excessive stress on the lower back and does not  further strengthen your abdominal muscles.  Control your return to the starting position. Repeat __________ times. Complete this exercise __________ times per day.  STRENGTHENING - Quadruped, Opposite UE/LE Lift   Assume a hands and knees position on a firm surface. Keep your hands under your shoulders and your knees under your hips. You may place padding under your knees for comfort.  Find your neutral spine and gently tense your abdominal muscles so that you can maintain this position. Your shoulders and hips should form a rectangle that is parallel with the floor and is not twisted.  Keeping your trunk steady, lift your right hand no higher than your shoulder and then your left   leg no higher than your hip. Make sure you are not holding your breath. Hold this position for __________ seconds.  Continuing to keep your abdominal muscles tense and your back steady, slowly return to your starting position. Repeat with the opposite arm and leg. Repeat __________ times. Complete this exercise __________ times per day.  STRENGTHENING - Abdominals and Quadriceps, Straight Leg Raise   Lie on a firm bed or floor with both legs extended in front of you.  Keeping one leg in contact with the floor, bend the other knee so that your foot can rest flat on the floor.  Find your neutral spine, and tense your abdominal muscles to maintain your spinal position throughout the exercise.  Slowly lift your straight leg off the floor about 6 inches for a count of 15, making sure to not hold your breath.  Still keeping your neutral spine, slowly lower your leg all the way to the floor. Repeat this exercise with each leg __________ times. Complete this exercise __________ times per day. POSTURE AND BODY MECHANICS CONSIDERATIONS - Low Back Sprain Keeping correct posture when sitting, standing or completing your activities will reduce the stress put on different body tissues, allowing injured tissues a chance to heal  and limiting painful experiences. The following are general guidelines for improved posture. Your physician or physical therapist will provide you with any instructions specific to your needs. While reading these guidelines, remember:  The exercises prescribed by your provider will help you have the flexibility and strength to maintain correct postures.  The correct posture provides the best environment for your joints to work. All of your joints have less wear and tear when properly supported by a spine with good posture. This means you will experience a healthier, less painful body.  Correct posture must be practiced with all of your activities, especially prolonged sitting and standing. Correct posture is as important when doing repetitive low-stress activities (typing) as it is when doing a single heavy-load activity (lifting). RESTING POSITIONS Consider which positions are most painful for you when choosing a resting position. If you have pain with flexion-based activities (sitting, bending, stooping, squatting), choose a position that allows you to rest in a less flexed posture. You would want to avoid curling into a fetal position on your side. If your pain worsens with extension-based activities (prolonged standing, working overhead), avoid resting in an extended position such as sleeping on your stomach. Most people will find more comfort when they rest with their spine in a more neutral position, neither too rounded nor too arched. Lying on a non-sagging bed on your side with a pillow between your knees, or on your back with a pillow under your knees will often provide some relief. Keep in mind, being in any one position for a prolonged period of time, no matter how correct your posture, can still lead to stiffness. PROPER SITTING POSTURE In order to minimize stress and discomfort on your spine, you must sit with correct posture. Sitting with good posture should be effortless for a healthy body.  Returning to good posture is a gradual process. Many people can work toward this most comfortably by using various supports until they have the flexibility and strength to maintain this posture on their own. When sitting with proper posture, your ears will fall over your shoulders and your shoulders will fall over your hips. You should use the back of the chair to support your upper back. Your lower back will be in a neutral   position, just slightly arched. You may place a small pillow or folded towel at the base of your lower back for  support.  When working at a desk, create an environment that supports good, upright posture. Without extra support, muscles tire, which leads to excessive strain on joints and other tissues. Keep these recommendations in mind: CHAIR:  A chair should be able to slide under your desk when your back makes contact with the back of the chair. This allows you to work closely.  The chair's height should allow your eyes to be level with the upper part of your monitor and your hands to be slightly lower than your elbows. BODY POSITION  Your feet should make contact with the floor. If this is not possible, use a foot rest.  Keep your ears over your shoulders. This will reduce stress on your neck and low back. INCORRECT SITTING POSTURES  If you are feeling tired and unable to assume a healthy sitting posture, do not slouch or slump. This puts excessive strain on your back tissues, causing more damage and pain. Healthier options include:  Using more support, like a lumbar pillow.  Switching tasks to something that requires you to be upright or walking.  Talking a brief walk.  Lying down to rest in a neutral-spine position. PROLONGED STANDING WHILE SLIGHTLY LEANING FORWARD  When completing a task that requires you to lean forward while standing in one place for a long time, place either foot up on a stationary 2-4 inch high object to help maintain the best posture. When  both feet are on the ground, the lower back tends to lose its slight inward curve. If this curve flattens (or becomes too large), then the back and your other joints will experience too much stress, tire more quickly, and can cause pain. CORRECT STANDING POSTURES Proper standing posture should be assumed with all daily activities, even if they only take a few moments, like when brushing your teeth. As in sitting, your ears should fall over your shoulders and your shoulders should fall over your hips. You should keep a slight tension in your abdominal muscles to brace your spine. Your tailbone should point down to the ground, not behind your body, resulting in an over-extended swayback posture.  INCORRECT STANDING POSTURES  Common incorrect standing postures include a forward head, locked knees and/or an excessive swayback. WALKING Walk with an upright posture. Your ears, shoulders and hips should all line-up. PROLONGED ACTIVITY IN A FLEXED POSITION When completing a task that requires you to bend forward at your waist or lean over a low surface, try to find a way to stabilize 3 out of 4 of your limbs. You can place a hand or elbow on your thigh or rest a knee on the surface you are reaching across. This will provide you more stability, so that your muscles do not tire as quickly. By keeping your knees relaxed, or slightly bent, you will also reduce stress across your lower back. CORRECT LIFTING TECHNIQUES DO :  Assume a wide stance. This will provide you more stability and the opportunity to get as close as possible to the object which you are lifting.  Tense your abdominals to brace your spine. Bend at the knees and hips. Keeping your back locked in a neutral-spine position, lift using your leg muscles. Lift with your legs, keeping your back straight.  Test the weight of unknown objects before attempting to lift them.  Try to keep your elbows locked down   at your sides in order get the best  strength from your shoulders when carrying an object.  Always ask for help when lifting heavy or awkward objects. INCORRECT LIFTING TECHNIQUES DO NOT:   Lock your knees when lifting, even if it is a small object.  Bend and twist. Pivot at your feet or move your feet when needing to change directions.  Assume that you can safely pick up even a paperclip without proper posture.   This information is not intended to replace advice given to you by your health care provider. Make sure you discuss any questions you have with your health care provider.   Document Released: 05/02/2005 Document Revised: 05/23/2014 Document Reviewed: 08/14/2008 [employer] Interactive Patient Education 2016 [employer]  

## 2016-01-14 NOTE — Progress Notes (Signed)
Subjective:    Patient ID: Bryce Rush, male    DOB: [birth year], 55 y.o.   MRN: [id number] By signing my name below, I, Javier Dockerobert Ryan Halas, attest that this documentation has been prepared under the direction and in the presence of Nilda SimmerKristi Erna Brossard, MD. Electronically Signed: Javier Dockerobert Ryan Halas, ER Scribe. 01/14/2016. 11:09 AM.  01/14/2016  Back Pain  HPI  HPI Comments: Bryce Rush is a 55 y.o. male who [facility] complaining of two days of constant 7/10 lower back pain. He does not recall any specific injury. 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 He denies numbness, tingling, burning, saddle anesthesia, or loss of bowel or bladder control. He has been taking 1-2 aleve daily which lowers his pain to a 4/10. He did not sleep well last night due to pain. He used a heating pad last night. He does not have a significant hx of low back pain.  His PCP is Dr. Wynelle LinkSun with [facility]. He has taken muscle relaxers in the past.    Review of Systems  Constitutional: Negative for chills and fever.  Genitourinary: Negative for decreased urine volume and difficulty urinating.  Musculoskeletal: Positive for back pain. Negative for arthralgias and myalgias.  Neurological: Negative for weakness and numbness.    Past Medical History:  Diagnosis Date  . Anxiety   . Arthritis   . Asthma    AS CHILD   . Chest pain    Myoview done 12/11/2009  . Depression   . Dyslipidemia   . GERD (gastroesophageal reflux disease)   . Headache(784.0)   . Hypertension   . Hypertriglyceridemia   . Morbid obesity (HCC)   . Seasonal allergies    Past Surgical History:  Procedure Laterality Date  . bilateral eye surgery     X 4- as a child,     X1   1984   (RT)  . COLONOSCOPY  07/25/2011   Procedure: COLONOSCOPY;  Surgeon: West BaliSandi L Fields, MD;  [facility];  Service: Endoscopy;  Laterality: N/A;  8:30 AM  . INGUINAL HERNIA REPAIR Right 03/24/2015   Procedure: LAPAROSCOPIC INGUINAL HERNIA WITH MESH;  Surgeon: Axel FillerArmando Ramirez, MD;  Location: [facility];  Service: General;  Laterality: Right;  . INSERTION OF MESH Right 03/24/2015   Procedure: INSERTION OF MESH;  Surgeon: Axel FillerArmando Ramirez, MD;  [facility];  Service: General;  Laterality: Right;  . ROTATOR CUFF REPAIR     [facility]  . UMBILICAL HERNIA REPAIR N/A 03/24/2015   Procedure: LARARASCOPIC HERNIA REPAIR UMBILICAL ADULT;  Surgeon: Axel FillerArmando Ramirez, MD;  Location: [facility] OR;  Service: General;  Laterality: N/A;   Allergies  Allergen Reactions  . Penicillins Other (See Comments)    Unknown childhood allergic reaction  . Sulfa Antibiotics Other (See Comments)    Unknown childhood allergic reaction  . Tape Rash    Please use paper tape    Social History   Social History  . Marital status: Married    Spouse name: N/A  . Number of children: N/A  . Years of education: N/A   Occupational History  . Full time    Social History Main Topics  . Smoking status: Former Smoker    Packs/day: 0.10    Years: 3.00    Types: Cigarettes    Quit date: 02/18/2013  . Smokeless tobacco: Not on file  . Alcohol use 4.8 oz/week    6 Cans of beer, 2  Shots of liquor per week     Comment: WEEKENDS  . Drug use:     Types: Marijuana     Comment: Occasional- last used 1 yr ago   . Sexual activity: Not on file   Other Topics Concern  . Not on file   Social History Narrative   Married   No regular exercise   Family History  Problem Relation Age of Onset  . Pulmonary embolism Mother   . Heart attack Father   . Colon cancer Neg Hx        Objective:    BP 122/70 (BP Location: Right Arm, Patient Position: Sitting, Cuff Size: Normal)   Pulse 62   Temp 98 F (36.7 C) (Oral)   Resp 16   Ht 5' 9.75" (1.772 m)   Wt 211 lb 12.8 oz (96.1 kg)   SpO2 98%   BMI 30.61 kg/m  Physical Exam  Constitutional: He is oriented to person, place, and time. He appears well-developed and  well-nourished. No distress.  HENT:  Head: Normocephalic and atraumatic.  Eyes: Conjunctivae and EOM are normal. Pupils are equal, round, and reactive to light.  Neck: Normal range of motion. Neck supple. Carotid bruit is not present. No thyromegaly present.  Cardiovascular: Normal rate, regular rhythm, normal heart sounds and intact distal pulses.  Exam reveals no gallop and no friction rub.   No murmur heard. Pulmonary/Chest: Effort normal and breath sounds normal. No respiratory distress. He has no wheezes. He has no rales.  Musculoskeletal: Normal range of motion.  TTP in left lumbar paraspinal region. Straight leg raise negative. Motor strength 5/5. Pain with flexion and bending side to side.  Lymphadenopathy:    He has no cervical adenopathy.  Neurological: He is alert and oriented to person, place, and time. No cranial nerve deficit. Coordination normal.  Skin: Skin is warm and dry. No rash noted. He is not diaphoretic.  Psychiatric: He has a normal mood and affect. His behavior is normal.  Nursing note and vitals reviewed.       Assessment & Plan:   1. Left-sided low back pain without sciatica     Orders Placed This Encounter  Procedures  . DG Lumbar Spine Complete    Standing Status:   Future    Number of Occurrences:   1    Standing Expiration Date:   01/13/2017    Order Specific Question:   Reason for Exam (SYMPTOM  OR DIAGNOSIS REQUIRED)    Answer:   low back pain L    Order Specific Question:   Preferred imaging location?    Answer:   External   Meds ordered this encounter  Medications  . methocarbamol (ROBAXIN) 500 MG tablet    Sig: Take 1-2 tablets (500-1,000 mg total) by mouth 4 (four) times daily.    Dispense:  45 tablet    Refill:  0    No Follow-up on file.     I personally performed the services described in this documentation, which was scribed in my presence. The recorded information has been reviewed and considered.  Tanica Gaige Paulita Fujita,  M.D. [facility] [address] [place], [place]  [address] [phone] phone [phone] fax

## 2017-08-29 IMAGING — CT CT ABD-PELV W/ CM
2 of 5 series · 16 of 46 positions shown, 18 images · IV contrast (Omni 300)
Comparison: CT of the abdomen and pelvis from 07/19/2011

CLINICAL DATA: Acute onset of right lower quadrant abdominal pain
for 1 day. Initial encounter.

EXAM:
CT ABDOMEN AND PELVIS WITH CONTRAST
TECHNIQUE: Multidetector CT imaging of the abdomen and pelvis was performed
using the standard protocol following bolus administration of
intravenous contrast.
CONTRAST:  100mL OMNIPAQUE IOHEXOL 300 MG/ML  SOLN

[Series 3: a/p w/ 5mm · axial · 0.82mm/px · z∈[-484,+0]mm · 13 of 109 slices shown, 15 images]
[im 6/109  soft-tissue]
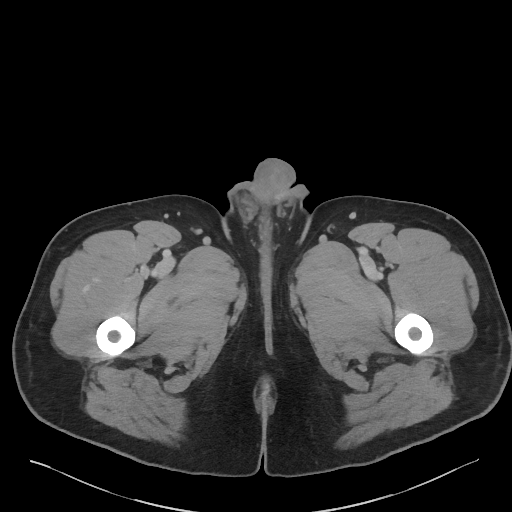
[im 6/109  bone]
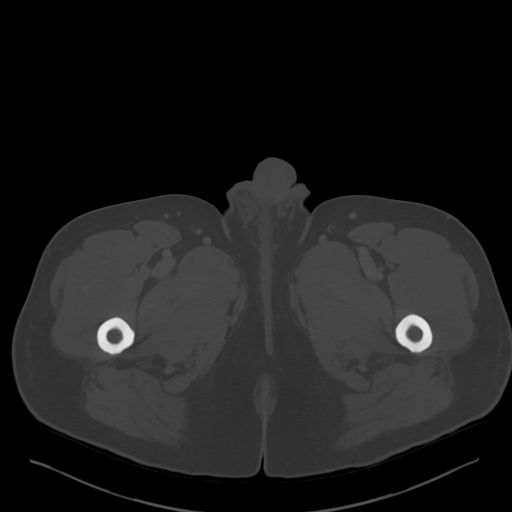
[im 18/109  soft-tissue]
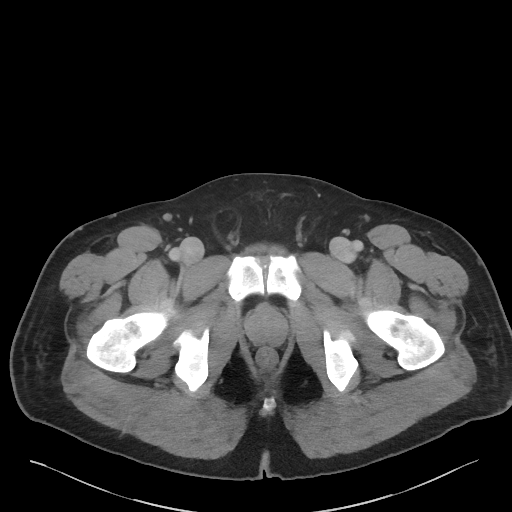
[im 23/109  soft-tissue]
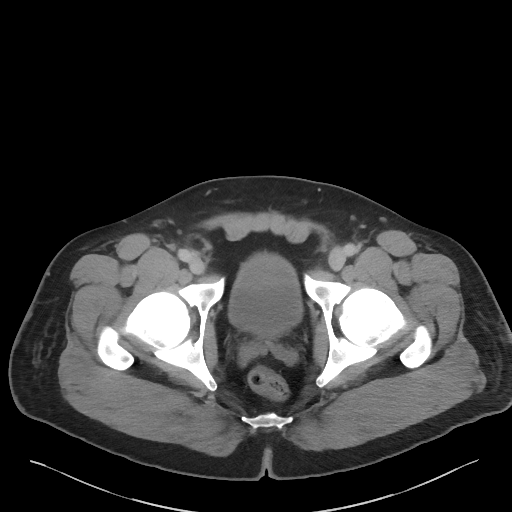
[im 29/109  soft-tissue]
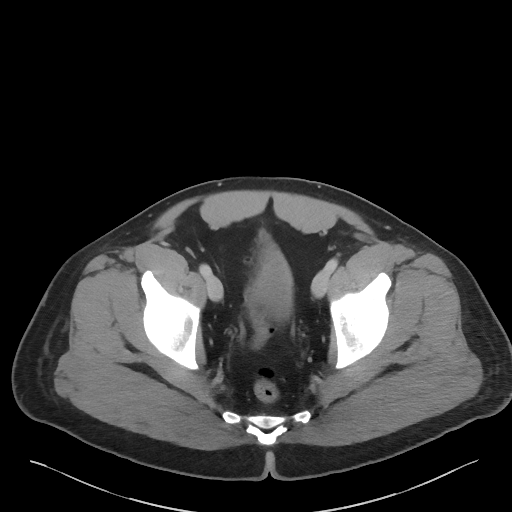
[im 40/109  soft-tissue]
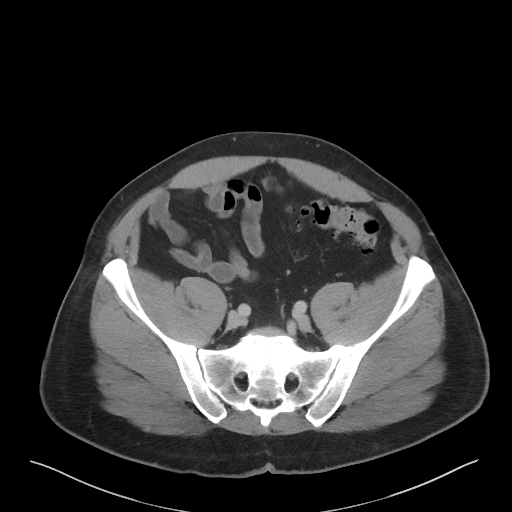
[im 46/109  soft-tissue]
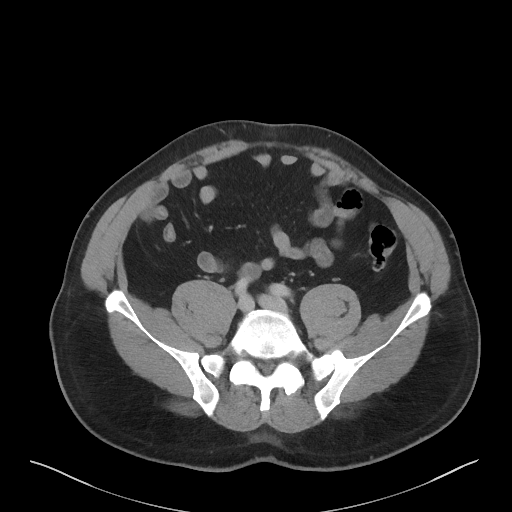
[im 57/109  soft-tissue]
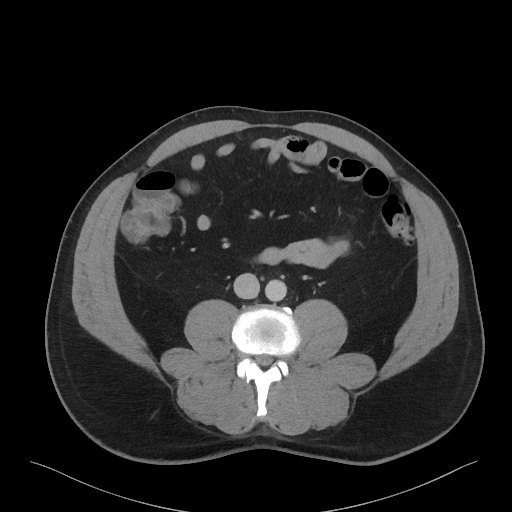
[im 63/109  soft-tissue]
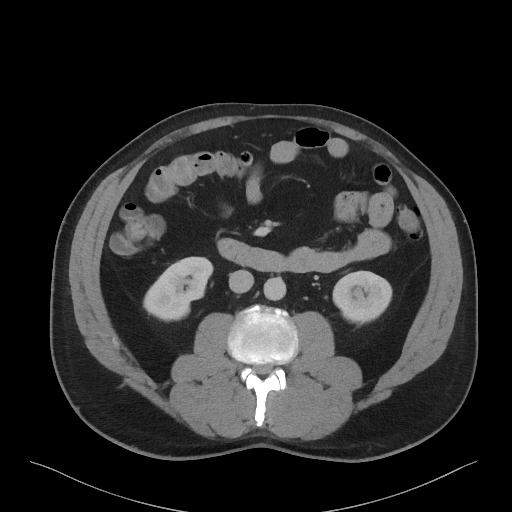
[im 69/109  soft-tissue]
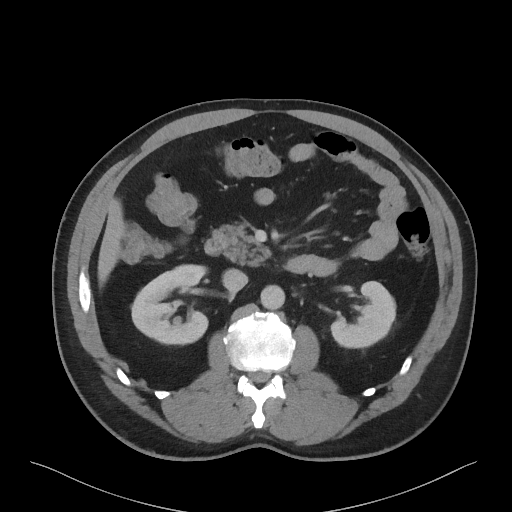
[im 69/109  bone]
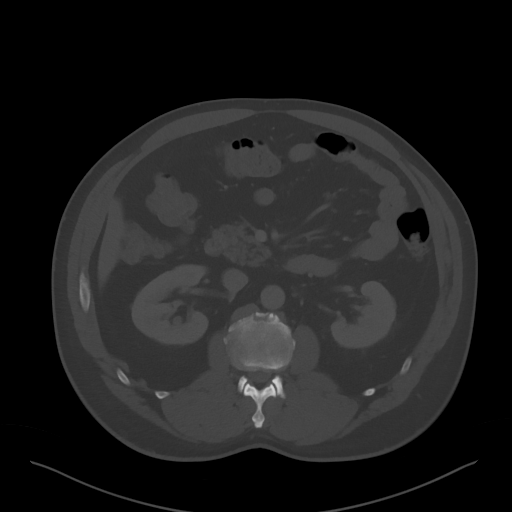
[im 80/109  soft-tissue]
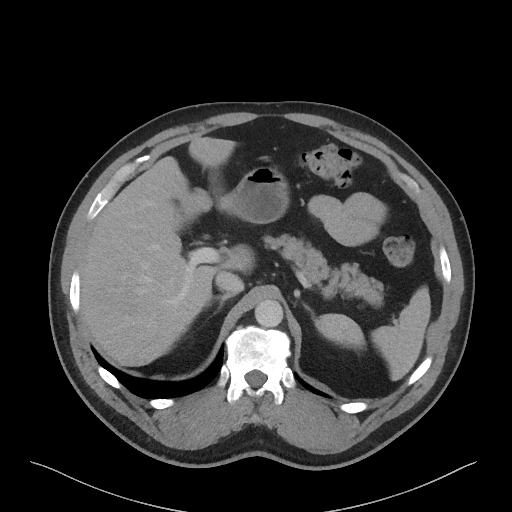
[im 86/109  soft-tissue]
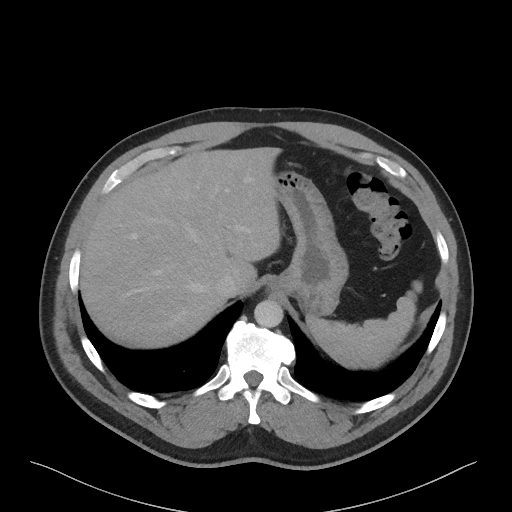
[im 91/109  soft-tissue]
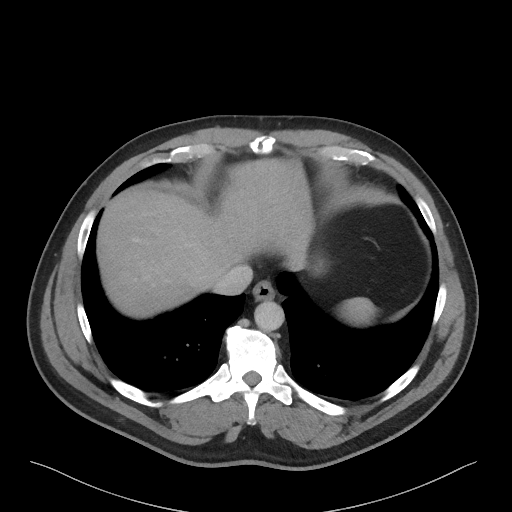
[im 103/109  soft-tissue]
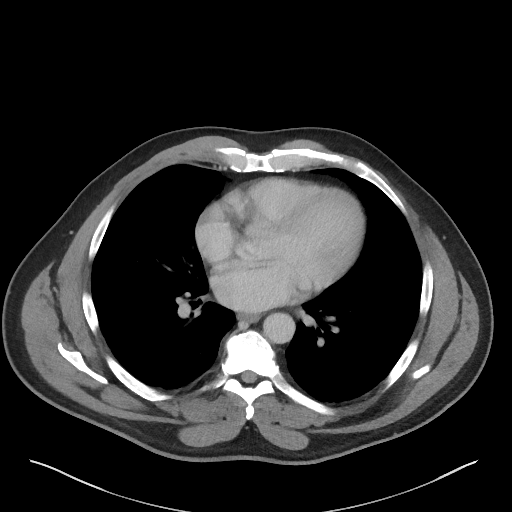

[Series 6: a/p w/ cor · coronal · 0.92mm/px · 3 of 149 slices shown]
[im 50/149  soft-tissue]
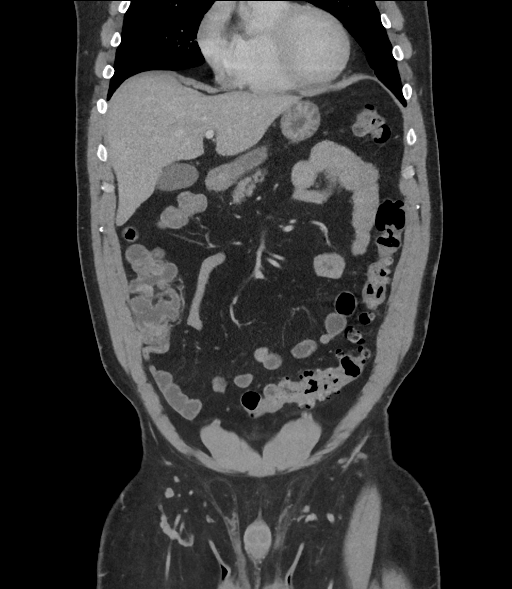
[im 66/149  soft-tissue]
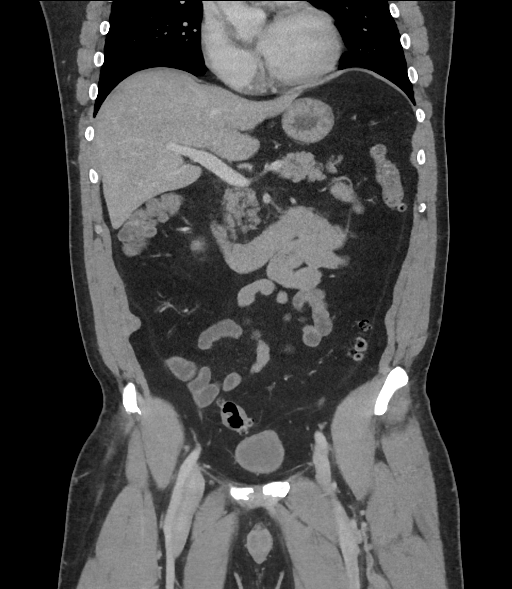
[im 83/149  soft-tissue]
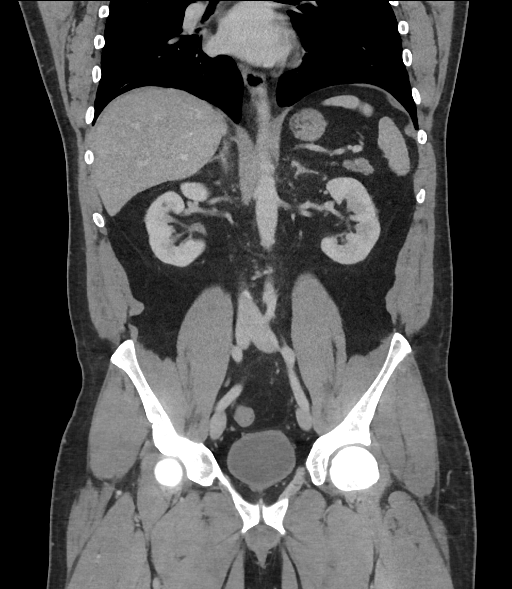

[16 of 46 positions shown; findings below may reference images not displayed]

FINDINGS: Minimal bibasilar atelectasis is noted.

The liver and spleen are unremarkable in appearance. The gallbladder
is within normal limits. The pancreas and adrenal glands are
unremarkable.

The kidneys are unremarkable in appearance. There is no evidence of
hydronephrosis. No renal or ureteral stones are seen. Minimal
nonspecific perinephric stranding is noted bilaterally.

No free fluid is identified. The small bowel is unremarkable in
appearance. The stomach is within normal limits. No acute vascular
abnormalities are seen.

The appendix is normal in caliber, without evidence of appendicitis.
Diverticulosis is noted along the transverse, descending and
proximal sigmoid colon, without evidence of diverticulitis.

The bladder is mildly distended and grossly unremarkable. The
prostate remains normal in size. A small right inguinal hernia is
noted, containing only fat. No inguinal lymphadenopathy is seen.

No acute osseous abnormalities are identified.
IMPRESSION: 1. No acute abnormality seen within the abdomen or pelvis.
2. Diverticulosis along the transverse, descending and proximal
sigmoid colon, without evidence of diverticulitis.
3. Small right inguinal hernia, containing only fat.

## 2017-10-09 ENCOUNTER — Encounter: Payer: Self-pay | Admitting: Family Medicine

## 2017-10-09 DIAGNOSIS — L300 Nummular dermatitis: Secondary | ICD-10-CM | POA: Diagnosis not present

## 2017-11-25 DIAGNOSIS — M5442 Lumbago with sciatica, left side: Secondary | ICD-10-CM | POA: Diagnosis not present

## 2017-11-27 DIAGNOSIS — M5489 Other dorsalgia: Secondary | ICD-10-CM | POA: Diagnosis not present

## 2017-12-01 DIAGNOSIS — M545 Low back pain: Secondary | ICD-10-CM | POA: Diagnosis not present

## 2017-12-07 DIAGNOSIS — M545 Low back pain: Secondary | ICD-10-CM | POA: Diagnosis not present

## 2017-12-07 DIAGNOSIS — M5416 Radiculopathy, lumbar region: Secondary | ICD-10-CM | POA: Diagnosis not present

## 2017-12-26 DIAGNOSIS — M545 Low back pain: Secondary | ICD-10-CM | POA: Diagnosis not present

## 2018-02-13 DIAGNOSIS — I10 Essential (primary) hypertension: Secondary | ICD-10-CM | POA: Diagnosis not present

## 2018-02-13 DIAGNOSIS — Z23 Encounter for immunization: Secondary | ICD-10-CM | POA: Diagnosis not present

## 2018-02-13 DIAGNOSIS — E669 Obesity, unspecified: Secondary | ICD-10-CM | POA: Diagnosis not present

## 2018-02-13 DIAGNOSIS — J309 Allergic rhinitis, unspecified: Secondary | ICD-10-CM | POA: Diagnosis not present

## 2018-07-23 IMAGING — DX DG LUMBAR SPINE COMPLETE 4+V
5 series · 5 of 5 positions shown · non-contrast
Comparison: 02/20/2015

CLINICAL DATA: Low back pain

EXAM:
LUMBAR SPINE - COMPLETE 4+ VIEW

[l-spine ap]
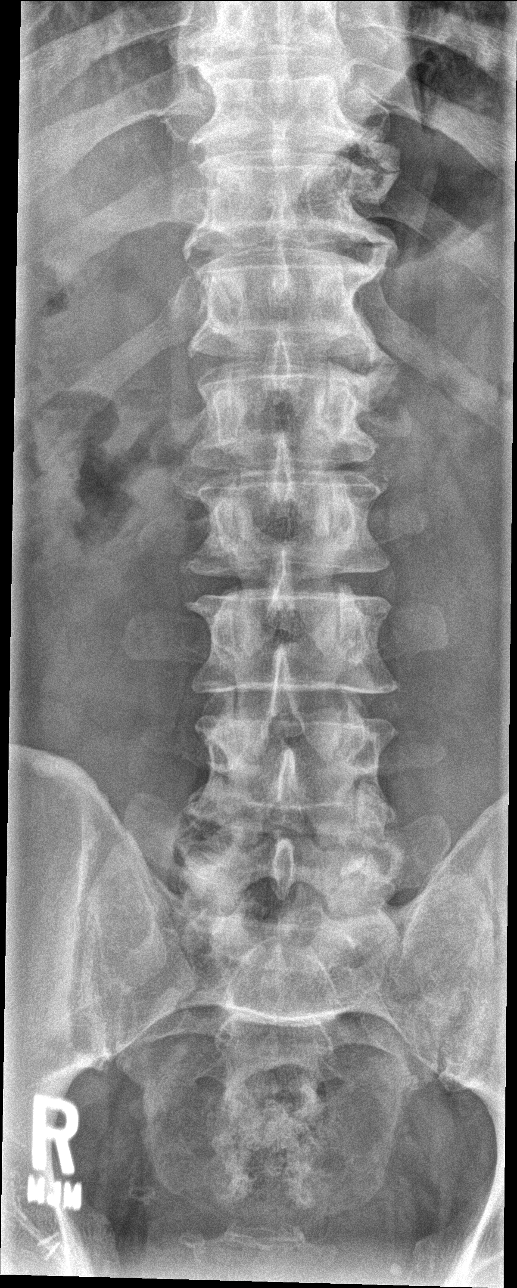

[l-spine obl (1 of 2)]
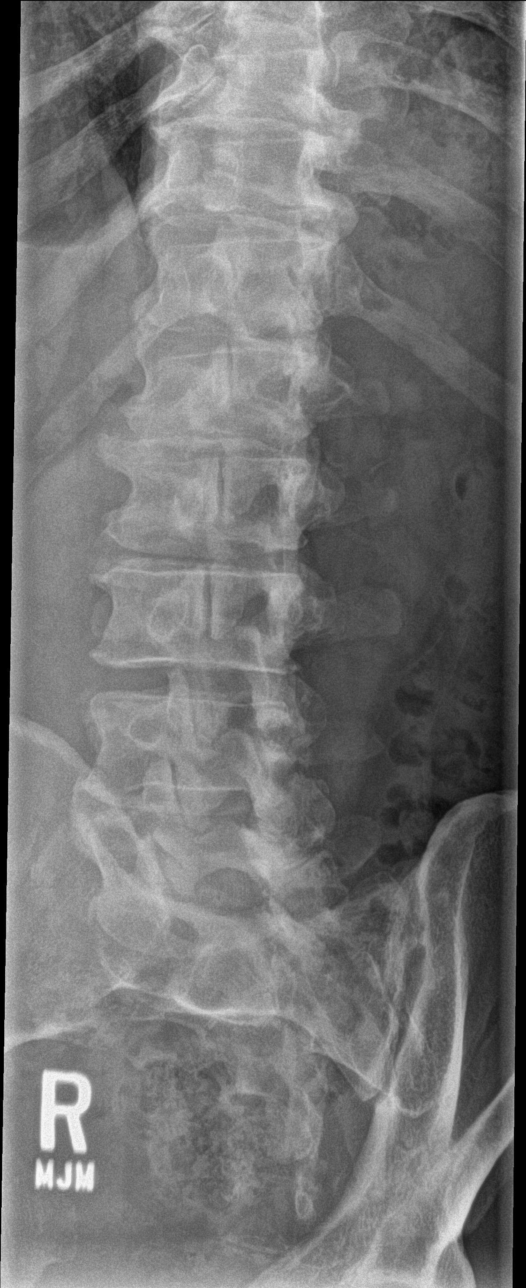

[l-spine obl (2 of 2)]
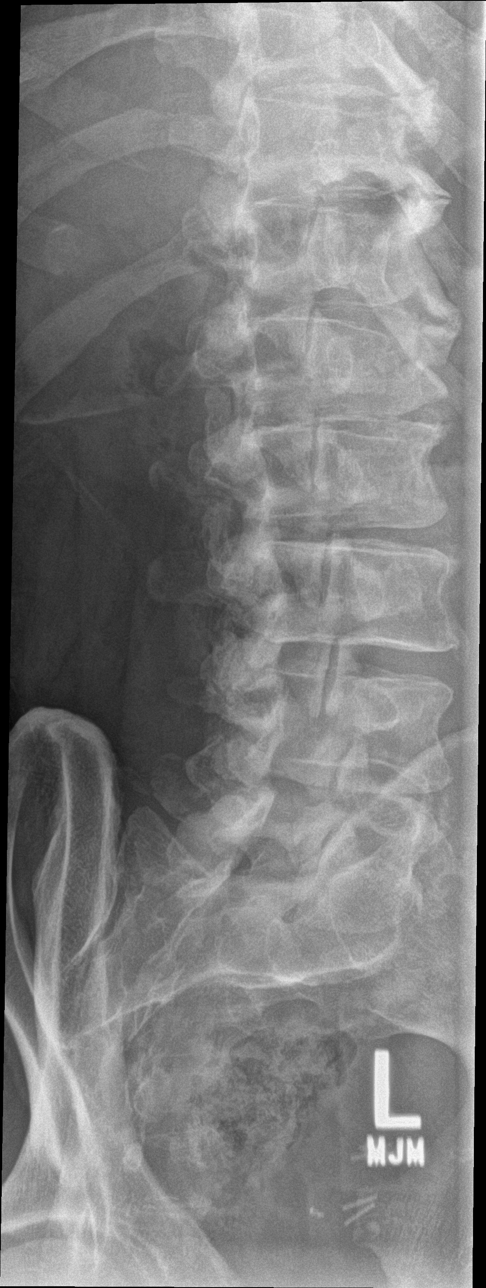

[l-spine lat]
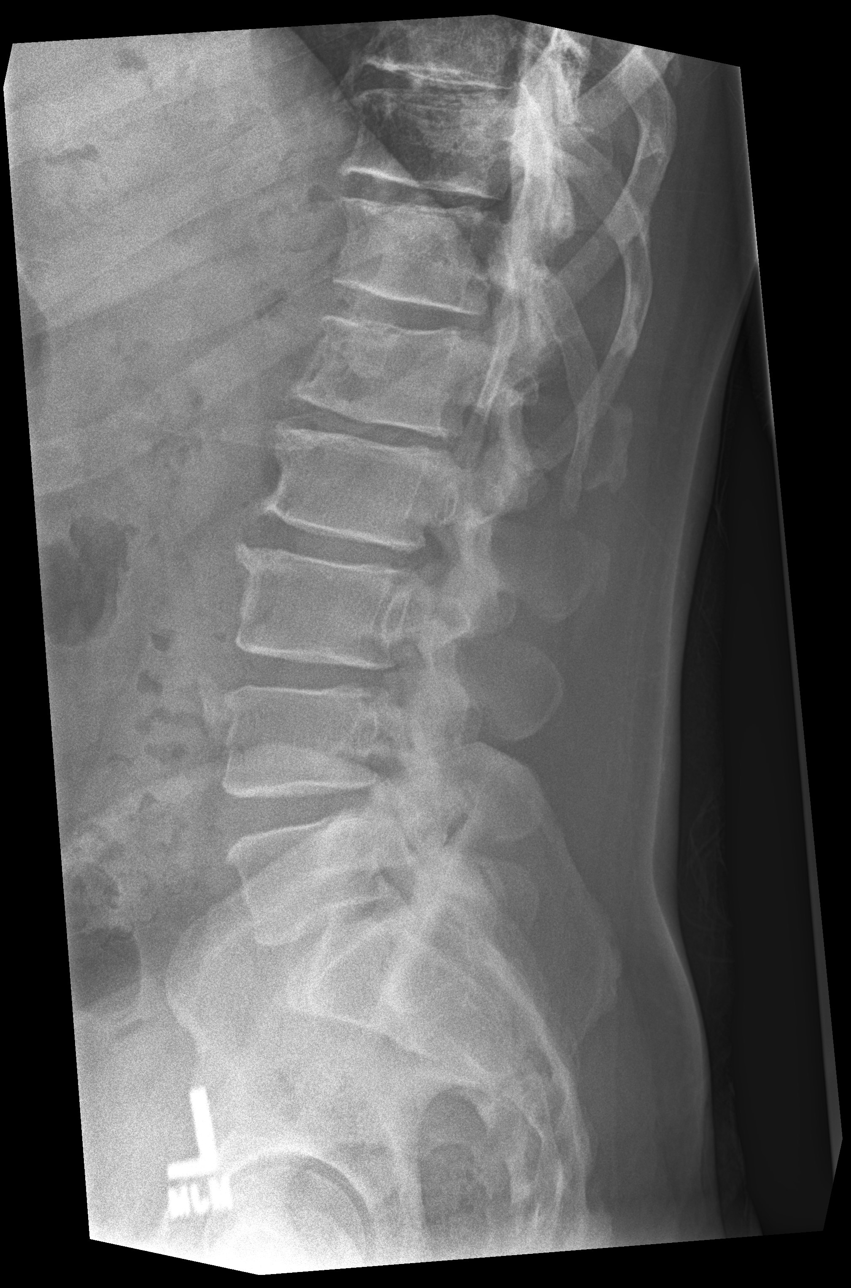

[l-spine l5-s1]
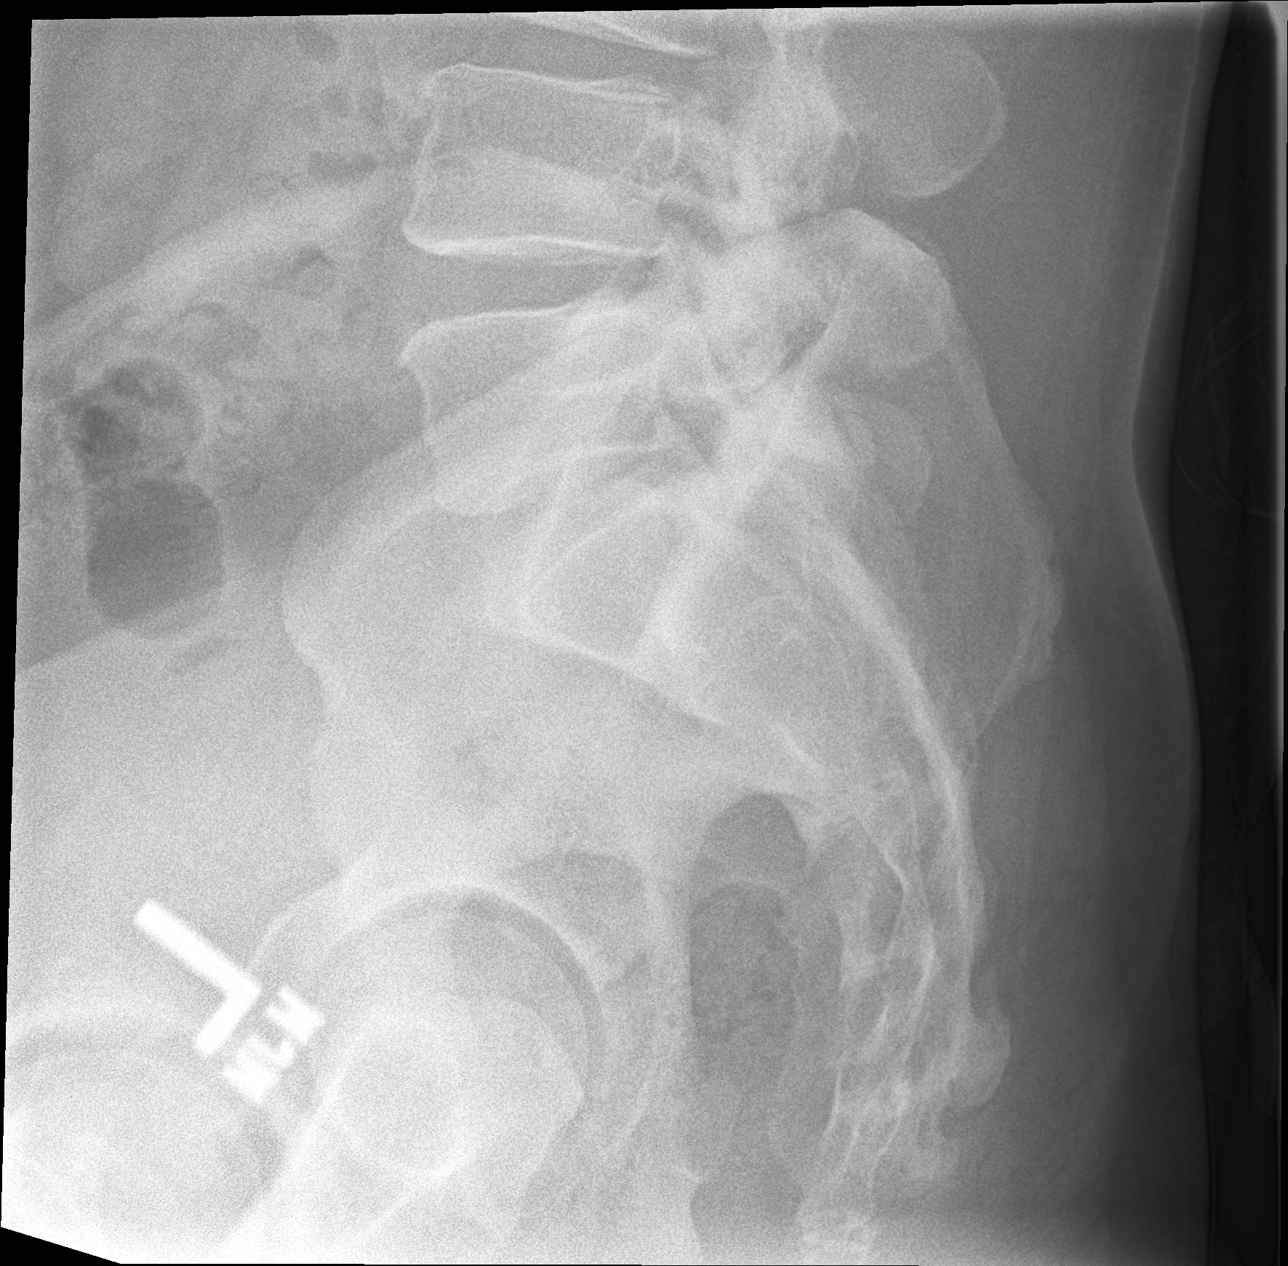

[5 of 5 positions shown; findings below may reference images not displayed]

FINDINGS: Normal alignment of the lumbar spine. The vertebral body heights are
well preserved. There is multi level disc space narrowing and
ventral endplate spurring from T12-L1 through L2-L3. No acute
fracture or subluxation identified.
IMPRESSION: 1. Multi level degenerative disc disease.
2. No acute findings.

## 2018-08-02 DIAGNOSIS — J0110 Acute frontal sinusitis, unspecified: Secondary | ICD-10-CM | POA: Diagnosis not present

## 2018-10-19 DIAGNOSIS — Z20828 Contact with and (suspected) exposure to other viral communicable diseases: Secondary | ICD-10-CM | POA: Diagnosis not present

## 2018-12-28 DIAGNOSIS — R252 Cramp and spasm: Secondary | ICD-10-CM | POA: Diagnosis not present

## 2018-12-31 DIAGNOSIS — F411 Generalized anxiety disorder: Secondary | ICD-10-CM | POA: Diagnosis not present

## 2018-12-31 DIAGNOSIS — I10 Essential (primary) hypertension: Secondary | ICD-10-CM | POA: Diagnosis not present

## 2018-12-31 DIAGNOSIS — N289 Disorder of kidney and ureter, unspecified: Secondary | ICD-10-CM | POA: Diagnosis not present

## 2019-02-01 DIAGNOSIS — Z23 Encounter for immunization: Secondary | ICD-10-CM | POA: Diagnosis not present

## 2019-02-01 DIAGNOSIS — F411 Generalized anxiety disorder: Secondary | ICD-10-CM | POA: Diagnosis not present

## 2019-02-01 DIAGNOSIS — I10 Essential (primary) hypertension: Secondary | ICD-10-CM | POA: Diagnosis not present

## 2019-03-27 DIAGNOSIS — F411 Generalized anxiety disorder: Secondary | ICD-10-CM | POA: Diagnosis not present

## 2019-03-27 DIAGNOSIS — R197 Diarrhea, unspecified: Secondary | ICD-10-CM | POA: Diagnosis not present

## 2019-04-19 DIAGNOSIS — F411 Generalized anxiety disorder: Secondary | ICD-10-CM | POA: Diagnosis not present

## 2019-04-19 DIAGNOSIS — I10 Essential (primary) hypertension: Secondary | ICD-10-CM | POA: Diagnosis not present

## 2019-04-19 DIAGNOSIS — R7303 Prediabetes: Secondary | ICD-10-CM | POA: Diagnosis not present

## 2019-04-19 DIAGNOSIS — M546 Pain in thoracic spine: Secondary | ICD-10-CM | POA: Diagnosis not present

## 2019-04-19 DIAGNOSIS — E782 Mixed hyperlipidemia: Secondary | ICD-10-CM | POA: Diagnosis not present

## 2019-08-16 DIAGNOSIS — M2241 Chondromalacia patellae, right knee: Secondary | ICD-10-CM | POA: Diagnosis not present

## 2019-08-16 DIAGNOSIS — M1711 Unilateral primary osteoarthritis, right knee: Secondary | ICD-10-CM | POA: Diagnosis not present

## 2019-08-16 DIAGNOSIS — M25561 Pain in right knee: Secondary | ICD-10-CM | POA: Diagnosis not present

## 2019-09-13 DIAGNOSIS — M25561 Pain in right knee: Secondary | ICD-10-CM | POA: Diagnosis not present

## 2019-09-25 DIAGNOSIS — M1711 Unilateral primary osteoarthritis, right knee: Secondary | ICD-10-CM | POA: Diagnosis not present

## 2019-09-25 DIAGNOSIS — M7651 Patellar tendinitis, right knee: Secondary | ICD-10-CM | POA: Diagnosis not present

## 2019-10-08 DIAGNOSIS — F411 Generalized anxiety disorder: Secondary | ICD-10-CM | POA: Diagnosis not present

## 2019-10-08 DIAGNOSIS — J309 Allergic rhinitis, unspecified: Secondary | ICD-10-CM | POA: Diagnosis not present

## 2019-10-08 DIAGNOSIS — I10 Essential (primary) hypertension: Secondary | ICD-10-CM | POA: Diagnosis not present

## 2020-04-03 DIAGNOSIS — I10 Essential (primary) hypertension: Secondary | ICD-10-CM | POA: Diagnosis not present

## 2020-04-03 DIAGNOSIS — F411 Generalized anxiety disorder: Secondary | ICD-10-CM | POA: Diagnosis not present

## 2020-04-03 DIAGNOSIS — R7303 Prediabetes: Secondary | ICD-10-CM | POA: Diagnosis not present

## 2020-04-03 DIAGNOSIS — M1711 Unilateral primary osteoarthritis, right knee: Secondary | ICD-10-CM | POA: Diagnosis not present

## 2020-04-16 DIAGNOSIS — M25512 Pain in left shoulder: Secondary | ICD-10-CM | POA: Diagnosis not present

## 2020-10-13 ENCOUNTER — Other Ambulatory Visit: Payer: Self-pay

## 2020-10-13 ENCOUNTER — Ambulatory Visit (INDEPENDENT_AMBULATORY_CARE_PROVIDER_SITE_OTHER): Payer: Managed Care, Other (non HMO)

## 2020-10-13 ENCOUNTER — Ambulatory Visit (INDEPENDENT_AMBULATORY_CARE_PROVIDER_SITE_OTHER): Payer: Managed Care, Other (non HMO) | Admitting: Podiatry

## 2020-10-13 DIAGNOSIS — M19079 Primary osteoarthritis, unspecified ankle and foot: Secondary | ICD-10-CM

## 2020-10-13 DIAGNOSIS — M19072 Primary osteoarthritis, left ankle and foot: Secondary | ICD-10-CM

## 2020-10-13 DIAGNOSIS — M778 Other enthesopathies, not elsewhere classified: Secondary | ICD-10-CM

## 2020-10-13 DIAGNOSIS — M722 Plantar fascial fibromatosis: Secondary | ICD-10-CM | POA: Diagnosis not present

## 2020-10-13 MED ORDER — DEXAMETHASONE SODIUM PHOSPHATE 120 MG/30ML IJ SOLN: 4.0000 mg | Freq: Once | INTRAMUSCULAR | Status: DC

## 2020-10-13 MED ORDER — MELOXICAM 15 MG PO TABS: 15.0000 mg | ORAL_TABLET | Freq: Every day | ORAL | 0 refills | Status: AC

## 2020-10-19 NOTE — Progress Notes (Signed)
Subjective:   Patient ID: Renita Papa, adult   DOB: 60 y.o.   MRN: [id number]   HPI 60 year old male presents the office today for concerns of left foot pain.  States that the pain is on the outside of his foot and describes a throbbing sensation.  This been ongoing last 4 to 5 months.  He denies any recent injury or treatment.  He has had right osteoarthritis of the knee getting gel shots he thinks he may be compensating potentially causing discomfort.   Review of Systems  All other systems reviewed and are negative.  Past Medical History:  Diagnosis Date  . Anxiety   . Arthritis   . Asthma    AS CHILD   . Chest pain    Myoview done 12/11/2009  . Depression   . Dyslipidemia   . GERD (gastroesophageal reflux disease)   . Headache(784.0)   . Hypertension   . Hypertriglyceridemia   . Morbid obesity (HCC)   . Seasonal allergies     Past Surgical History:  Procedure Laterality Date  . bilateral eye surgery     X 4- as a child,     X1   1984   (RT)  . COLONOSCOPY  07/25/2011   Procedure: COLONOSCOPY;  Surgeon: West Bali, MD;  [facility]: [facility];  Service: Endoscopy;  Laterality: N/A;  8:30 AM  . INGUINAL HERNIA REPAIR Right 03/24/2015   Procedure: LAPAROSCOPIC INGUINAL HERNIA WITH MESH;  Surgeon: Axel Filler, MD;  Location: [facility] OR;  Service: General;  Laterality: Right;  . INSERTION OF MESH Right 03/24/2015   Procedure: INSERTION OF MESH;  Surgeon: Axel Filler, MD;  [facility]: [facility];  Service: General;  Laterality: Right;  . ROTATOR CUFF REPAIR     Advertising account planner  . UMBILICAL HERNIA REPAIR N/A 03/24/2015   Procedure: LARARASCOPIC HERNIA REPAIR UMBILICAL ADULT;  Surgeon: Axel Filler, MD;  Location: [facility] OR;  Service: General;  Laterality: N/A;     Current Outpatient Medications:  .  cetirizine (ZYRTEC) 10 MG tablet, Take 1 tablet by mouth daily., Disp: , Rfl:  .  meloxicam (MOBIC) 15 MG tablet, Take 1 tablet (15 mg total) by mouth daily., Disp: 30  tablet, Rfl: 0 .  aspirin EC 81 MG tablet, Take 81 mg by mouth every Monday, Wednesday, and Friday. , Disp: , Rfl:  .  citalopram (CELEXA) 40 MG tablet, Take 40 mg by mouth daily.  , Disp: , Rfl:  .  diclofenac (VOLTAREN) 75 MG EC tablet, Take 75 mg by mouth 2 (two) times daily., Disp: , Rfl:  .  ezetimibe (ZETIA) 10 MG tablet, Take 10 mg by mouth daily., Disp: , Rfl:  .  fexofenadine (ALLEGRA) 180 MG tablet, Take 180 mg by mouth daily., Disp: , Rfl:  .  lisinopril (PRINIVIL,ZESTRIL) 10 MG tablet, Take 10 mg by mouth daily.  , Disp: , Rfl:  .  methocarbamol (ROBAXIN) 500 MG tablet, Take 1-2 tablets (500-1,000 mg total) by mouth 4 (four) times daily., Disp: 45 tablet, Rfl: 0 .  Multiple Vitamins-Minerals (MULTIVITAMIN GUMMIES ADULT) CHEW, Chew 2 tablets by mouth at bedtime., Disp: , Rfl:  .  naproxen sodium (ALEVE) 220 MG tablet, Take 440 mg by mouth 2 (two) times daily as needed (pain)., Disp: , Rfl:  .  oxyCODONE-acetaminophen (ROXICET) 5-325 MG tablet, Take 1-2 tablets by mouth every 4 (four) hours as needed. (Patient not taking: Reported on 01/14/2016), Disp: 30 tablet, Rfl: 0 .  sertraline (ZOLOFT) 100 MG  tablet, Take 1 tablet by mouth daily., Disp: , Rfl:   Allergies  Allergen Reactions  . Penicillins Other (See Comments)    Unknown childhood allergic reaction  . Sulfa Antibiotics Other (See Comments)    Unknown childhood allergic reaction  . Sulfur     Other reaction(s): Tongue swelling, Pharyngeal swelling, Tongue swelling, Pharyngeal swelling  . Tape Rash    Please use paper tape         Objective:  Physical Exam  General: AAO x3, NAD  Dermatological: Skin is warm, dry and supple bilateral. There are no open sores, no preulcerative lesions, no rash or signs of infection present.  Vascular: Dorsalis Pedis artery and Posterior Tibial artery pedal pulses are 2/4 bilateral with immedate capillary fill time.  There is no pain with calf compression, swelling, warmth, erythema.    Neruologic: Grossly intact via light touch bilateral.   Musculoskeletal: Tenderness along the lateral aspect of the metatarsal head as well as plantarly on the fifth MPJ.  Localized edema there is no erythema or warmth.  No pain with MPJ range of motion. Muscular strength 5/5 in all groups tested bilateral.  Gait: Unassisted, Nonantalgic.       Assessment:   Capsulitis left foot     Plan:  -Treatment options discussed including all alternatives, risks, and complications -Etiology of symptoms were discussed -X-rays were obtained and reviewed with the patient.  No evidence of acute fracture or stress fracture.  Arthritic changes present the first MPJ. -Steroid injection performed.  Skin was prepped with betadine and a mixture of 0.5 cc of Marcaine plain, 0.5 cc of lidocaine plain was infiltrated into the fifth MPJ without any complications.  Postinjection care discussed.  He tolerated the procedure well with any complications. -Offloading, shoe modifications and good arch supports  Vivi Barrack DPM

## 2020-11-19 ENCOUNTER — Ambulatory Visit: Payer: Managed Care, Other (non HMO) | Admitting: Podiatry

## 2021-02-09 ENCOUNTER — Ambulatory Visit (INDEPENDENT_AMBULATORY_CARE_PROVIDER_SITE_OTHER): Payer: Managed Care, Other (non HMO)

## 2021-02-09 ENCOUNTER — Other Ambulatory Visit: Payer: Self-pay

## 2021-02-09 ENCOUNTER — Ambulatory Visit (INDEPENDENT_AMBULATORY_CARE_PROVIDER_SITE_OTHER): Payer: Managed Care, Other (non HMO) | Admitting: Podiatry

## 2021-02-09 ENCOUNTER — Encounter: Payer: Self-pay | Admitting: Podiatry

## 2021-02-09 DIAGNOSIS — M778 Other enthesopathies, not elsewhere classified: Secondary | ICD-10-CM

## 2021-02-09 DIAGNOSIS — M79672 Pain in left foot: Secondary | ICD-10-CM

## 2021-02-09 DIAGNOSIS — M216X9 Other acquired deformities of unspecified foot: Secondary | ICD-10-CM

## 2021-02-09 DIAGNOSIS — M19079 Primary osteoarthritis, unspecified ankle and foot: Secondary | ICD-10-CM | POA: Diagnosis not present

## 2021-02-12 NOTE — Progress Notes (Signed)
Subjective: 60 year old male presents the office today for follow-up evaluation of left foot pain.  He states the injection helped very little and the pain came back.  He is asking about a soft cast today in a boot to stay off the foot.  Also interested in orthotics.  Objective: AAO x3, NAD DP/PT pulses palpable bilaterally, CRT less than 3 seconds Cavus foot type is present.  Decreased range of motion of the first MPJ.  There is still tenderness there is localized edema of the fifth MPJ area.  There is no area of pinpoint tenderness otherwise.  MMT 5/5. No pain with calf compression, swelling, warmth, erythema  Assessment: 60 year old male with capsulitis left foot, cavus foot type  Plan: -All treatment options discussed with the patient including all alternatives, risks, complications.  -Still think a lot of his discomfort is coming from being on his feet as well as biomechanical changes with his high arch.  Today I applied an Radio broadcast assistant as well as a cam boot given his continued discomfort as well as localized swelling although minimal.  Before that he was measured for custom orthotics.  No was provided for work -Patient encouraged to call the office with any questions, concerns, change in symptoms.   Vivi Barrack DPM

## 2021-03-09 ENCOUNTER — Ambulatory Visit: Payer: Managed Care, Other (non HMO) | Admitting: Podiatry

## 2021-03-18 ENCOUNTER — Other Ambulatory Visit: Payer: Self-pay

## 2021-03-18 ENCOUNTER — Ambulatory Visit (INDEPENDENT_AMBULATORY_CARE_PROVIDER_SITE_OTHER): Payer: Managed Care, Other (non HMO) | Admitting: Podiatry

## 2021-03-18 DIAGNOSIS — M216X9 Other acquired deformities of unspecified foot: Secondary | ICD-10-CM

## 2021-03-18 DIAGNOSIS — M778 Other enthesopathies, not elsewhere classified: Secondary | ICD-10-CM

## 2021-03-18 DIAGNOSIS — M19079 Primary osteoarthritis, unspecified ankle and foot: Secondary | ICD-10-CM | POA: Diagnosis not present

## 2021-03-18 NOTE — Patient Instructions (Signed)

## 2021-03-21 NOTE — Progress Notes (Signed)
Subjective: 60 year old male presents the office today for follow-up evaluation of left foot pain.  He presents today to pick up orthotics.  He states he was not able to tolerate the cam boot so he stopped wearing after about a week.  No recent injury or changes otherwise.  Has no other concerns today.    Objective: AAO x3, NAD DP/PT pulses palpable bilaterally, CRT less than 3 seconds Cavus foot type is present.  Decreased range of motion of the first MPJ.  There is still tenderness there is localized edema of the fifth MPJ area.  There is no area of pinpoint tenderness otherwise.  MMT 5/5. No pain with calf compression, swelling, warmth, erythema  Assessment: 60 year old male with capsulitis left foot, cavus foot type  Plan: -All treatment options discussed with the patient including all alternatives, risks, complications.  -To orthotics were dispensed.  They were fit for shoes.  Oral, written break-in instructions were provided.  Continue with shoes with good support.  Discussed that he needs any modifications to let us know.   Vivi Barrack DPM

## 2021-07-22 ENCOUNTER — Encounter: Payer: Self-pay | Admitting: *Deleted

## 2021-11-04 ENCOUNTER — Encounter: Payer: Self-pay | Admitting: Podiatry

## 2021-11-04 ENCOUNTER — Ambulatory Visit (INDEPENDENT_AMBULATORY_CARE_PROVIDER_SITE_OTHER): Payer: Managed Care, Other (non HMO) | Admitting: Podiatry

## 2021-11-04 DIAGNOSIS — L600 Ingrowing nail: Secondary | ICD-10-CM | POA: Diagnosis not present

## 2021-11-04 NOTE — Patient Instructions (Signed)

## 2021-11-05 NOTE — Progress Notes (Signed)
Subjective:   Patient ID: Bryce Rush, adult   DOB: 61 y.o.   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  Patient wants this done I explained the causes of problem and procedure and patient wants surgical intervention.  I recommended nail removal permanent he wants surgery and at this point I allowed him to read consent form going over what would be done and reviewed all possible complications associated with procedure.  Patient signed consent form I infiltrated 60 mg like Marcaine mixture sterile prep done using sterile instrumentation remove the nail exposed matrix applied phenol 5 applications 30 seconds followed by alcohol lavage sterile dressing gave instructions on soaks leave dressing on 24 hours take it off earlier if throbbing were to occur encouraged him to call questions concerns

## 2022-04-06 ENCOUNTER — Emergency Department (HOSPITAL_BASED_OUTPATIENT_CLINIC_OR_DEPARTMENT_OTHER)
Admission: EM | Admit: 2022-04-06 | Discharge: 2022-04-06 | Disposition: A | Discharge status: Home / Self Care | Payer: Managed Care, Other (non HMO) | Attending: Emergency Medicine | Admitting: Emergency Medicine

## 2022-04-06 ENCOUNTER — Encounter (HOSPITAL_BASED_OUTPATIENT_CLINIC_OR_DEPARTMENT_OTHER): Payer: Self-pay | Admitting: Emergency Medicine

## 2022-04-06 ENCOUNTER — Other Ambulatory Visit: Payer: Self-pay

## 2022-04-06 DIAGNOSIS — J029 Acute pharyngitis, unspecified: Secondary | ICD-10-CM | POA: Diagnosis not present

## 2022-04-06 DIAGNOSIS — R519 Headache, unspecified: Secondary | ICD-10-CM | POA: Diagnosis present

## 2022-04-06 DIAGNOSIS — R0981 Nasal congestion: Secondary | ICD-10-CM | POA: Diagnosis not present

## 2022-04-06 DIAGNOSIS — R059 Cough, unspecified: Secondary | ICD-10-CM | POA: Insufficient documentation

## 2022-04-06 DIAGNOSIS — Z87891 Personal history of nicotine dependence: Secondary | ICD-10-CM | POA: Diagnosis not present

## 2022-04-06 DIAGNOSIS — Z1152 Encounter for screening for COVID-19: Secondary | ICD-10-CM | POA: Diagnosis not present

## 2022-04-06 DIAGNOSIS — Z79899 Other long term (current) drug therapy: Secondary | ICD-10-CM | POA: Insufficient documentation

## 2022-04-06 DIAGNOSIS — J45909 Unspecified asthma, uncomplicated: Secondary | ICD-10-CM | POA: Diagnosis not present

## 2022-04-06 DIAGNOSIS — I10 Essential (primary) hypertension: Secondary | ICD-10-CM | POA: Insufficient documentation

## 2022-04-06 LAB — RESP PANEL BY RT-PCR (FLU A&B, COVID) ARPGX2
Influenza A by PCR: NEGATIVE
Influenza B by PCR: NEGATIVE
SARS Coronavirus 2 by RT PCR: NEGATIVE

## 2022-04-06 MED ORDER — PSEUDOEPHEDRINE HCL ER 120 MG PO TB12: 120.0000 mg | ORAL_TABLET | Freq: Two times a day (BID) | ORAL | 0 refills | Status: AC

## 2022-04-06 MED ORDER — NAPROXEN 500 MG PO TABS: 500.0000 mg | ORAL_TABLET | Freq: Two times a day (BID) | ORAL | 0 refills | Status: AC

## 2022-04-06 MED ORDER — NAPROXEN 250 MG PO TABS
500.0000 mg | ORAL_TABLET | Freq: Once | ORAL | Status: AC
  Administered 2022-04-06: 500 mg via ORAL
  Filled 2022-04-06: qty 2

## 2022-04-06 NOTE — Discharge Instructions (Signed)
You were evaluated in the Emergency Department and after careful evaluation, we did not find any emergent condition requiring admission or further testing in the hospital.  Your exam/testing today was overall reassuring.  Symptoms seem to be due to a sinus headache.  Use the Naprosyn twice daily for pain and inflammation.  Use the Sudafed twice daily to help open up your sinuses.  Recommend breathing in warm humid air through the nose.  Please return to the Emergency Department if you experience any worsening of your condition.  Thank you for allowing [place] to be a part of your care.

## 2022-04-06 NOTE — ED Triage Notes (Signed)
Sinus congestion, sore throat headache since Thursday. Some relief with OTC medications

## 2022-04-06 NOTE — ED Provider Notes (Signed)
[facility] Emergency Department Provider Note MRN:  [id number]  Arrival date & time: 04/06/22     Chief Complaint   Headache   History of Present Illness   Bryce Rush is a 61 y.o. year-old adult with a history of hypertension, obesity presenting to the ED with chief complaint of headache.  4 days of sore throat, nasal congestion, cough that has resolved.  Having more more sinus pressure.  Now having a frontal headache that he attributes to his sinuses.  Feels like a pressure.  Having pressure in the maxillary molars as well.  No numbness or weakness to the arms or legs, no chest pain or shortness of breath, no other complaints.  Review of Systems  A thorough review of systems was obtained and all systems are negative except as noted in the HPI and PMH.   Patient's Health History    Past Medical History:  Diagnosis Date   Anxiety    Arthritis    Asthma    AS CHILD    Chest pain    Myoview done 12/11/2009   Depression    Dyslipidemia    GERD (gastroesophageal reflux disease)    Headache(784.0)    Hypertension    Hypertriglyceridemia    Morbid obesity (HCC)    Seasonal allergies     Past Surgical History:  Procedure Laterality Date   bilateral eye surgery     X 4- as a child,     X1   1984   (RT)   COLONOSCOPY  07/25/2011   Procedure: COLONOSCOPY;  Surgeon: West Bali, MD;  Location: [facility];  Service: Endoscopy;  Laterality: N/A;  8:30 AM   INGUINAL HERNIA REPAIR Right 03/24/2015   Procedure: LAPAROSCOPIC INGUINAL HERNIA WITH MESH;  Surgeon: Axel Filler, MD;  Location: [facility] OR;  Service: General;  Laterality: Right;   INSERTION OF MESH Right 03/24/2015   Procedure: INSERTION OF MESH;  Surgeon: Axel Filler, MD;  Location: [facility] OR;  Service: General;  Laterality: Right;   ROTATOR CUFF REPAIR     [facility]   UMBILICAL HERNIA REPAIR N/A 03/24/2015   Procedure: LARARASCOPIC HERNIA REPAIR UMBILICAL ADULT;  Surgeon:  Axel Filler, MD;  Location: [facility] OR;  Service: General;  Laterality: N/A;    Family History  Problem Relation Age of Onset   Pulmonary embolism Mother    Heart attack Father    Colon cancer Neg Hx     Social History   Socioeconomic History   Marital status: Married    Spouse name: Not on file   Number of children: Not on file   Years of education: Not on file   Highest education level: Not on file  Occupational History   Occupation: Full time  Tobacco Use   Smoking status: Former    Packs/day: 0.10    Years: 3.00    Total pack years: 0.30    Types: Cigarettes    Quit date: 02/18/2013    Years since quitting: 9.1   Smokeless tobacco: Not on file  Substance and Sexual Activity   Alcohol use: Yes    Alcohol/week: 8.0 standard drinks of alcohol    Types: 6 Cans of beer, 2 Shots of liquor per week    Comment: WEEKENDS   Drug use: Yes    Types: Marijuana    Comment: Occasional- last used 1 yr ago    Sexual activity: Not on file  Other Topics Concern   Not on file  Social History Narrative   Married   No regular exercise   Social Determinants of Health   Financial Resource Strain: Not on file  Food Insecurity: Not on file  Transportation Needs: Not on file  Physical Activity: Not on file  Stress: Not on file  Social Connections: Not on file  Intimate Partner Violence: Not on file     Physical Exam   Vitals:   04/06/22 2122  BP: (!) 167/76  Pulse: 82  Resp: 16  Temp: 98.5 F (36.9 C)  SpO2: 97%    CONSTITUTIONAL: Well-appearing, NAD NEURO/PSYCH:  Alert and oriented x 3, normal and symmetric strength and sensation, normal coordination, normal speech EYES:  eyes equal and reactive ENT/NECK:  no LAD, no JVD CARDIO: Regular rate, well-perfused, normal S1 and S2 PULM:  CTAB no wheezing or rhonchi GI/GU:  non-distended, non-tender MSK/SPINE:  No gross deformities, no edema SKIN:  no rash, atraumatic   *Additional and/or pertinent findings included in MDM  below  Diagnostic and Interventional Summary    EKG Interpretation  Date/Time:    Ventricular Rate:    PR Interval:    QRS Duration:   QT Interval:    QTC Calculation:   R Axis:     Text Interpretation:         Labs Reviewed  RESP PANEL BY RT-PCR (FLU A&B, COVID) [id number]    No orders to display    Medications  naproxen (NAPROSYN) tablet 500 mg (has no administration in time range)     Procedures  /  Critical Care Procedures  ED Course and Medical Decision Making  Initial Impression and Ddx History and exam consistent with a viral sinus infection causing a sinus headache.  Reassuring neurological exam, no fever, no meningismus.  Appropriate for symptomatic management at home.  Past medical/surgical history that increases complexity of ED encounter: None  Interpretation of Diagnostics COVID and flu negative  Patient Reassessment and Ultimate Disposition/Management     Discharge  Patient management required discussion with the following services or consulting groups:  None  Complexity of Problems Addressed Acute complicated illness or Injury  Additional Data Reviewed and Analyzed Further history obtained from: None  Additional Factors Impacting ED Encounter Risk Prescriptions  Elmer Sow. Pilar Plate, MD [facility] Emergency Medicine [facility] [email or web address]  Final Clinical Impressions(s) / ED Diagnoses     ICD-10-CM   1. Sinus headache  R51.9       ED Discharge Orders          Ordered    pseudoephedrine (SUDAFED 12 HOUR) 120 MG 12 hr tablet  2 times daily        04/06/22 2327    naproxen (NAPROSYN) 500 MG tablet  2 times daily        04/06/22 2327             Discharge Instructions Discussed with and Provided to Patient:    Discharge Instructions      You were evaluated in the Emergency Department and after careful evaluation, we did not find any emergent condition requiring admission or further testing in the  hospital.  Your exam/testing today was overall reassuring.  Symptoms seem to be due to a sinus headache.  Use the Naprosyn twice daily for pain and inflammation.  Use the Sudafed twice daily to help open up your sinuses.  Recommend breathing in warm humid air through the nose.  Please return to the Emergency Department if you experience any worsening of your  condition.  Thank you for allowing [place] to be a part of your care.       Sabas Sous, MD 04/06/22 [phone]

## 2023-12-27 ENCOUNTER — Other Ambulatory Visit: Payer: Self-pay | Admitting: Family Medicine

## 2023-12-27 ENCOUNTER — Ambulatory Visit
Admission: RE | Admit: 2023-12-27 | Discharge: 2023-12-27 | Disposition: A | Discharge status: Home / Self Care | Payer: Worker's Compensation | Source: Ambulatory Visit | Attending: Family Medicine | Admitting: Family Medicine

## 2023-12-27 DIAGNOSIS — M25519 Pain in unspecified shoulder: Secondary | ICD-10-CM
